# Patient Record
Sex: Male | Born: 1975 | Hispanic: No | Marital: Married | Smoking: Never smoker
Health system: Southern US, Community
[De-identification: ages and names within clinical notes are randomized; demographics above are authoritative.]

## PROBLEM LIST (undated history)

## (undated) DIAGNOSIS — T7840XA Allergy, unspecified, initial encounter: Secondary | ICD-10-CM

## (undated) HISTORY — DX: Allergy, unspecified, initial encounter: T78.40XA

---

## 1998-09-22 ENCOUNTER — Inpatient Hospital Stay (HOSPITAL_COMMUNITY): Admission: EM | Admit: 1998-09-22 | Discharge: 1998-09-30 | Payer: Self-pay | Admitting: Emergency Medicine

## 1998-09-30 ENCOUNTER — Encounter: Admission: RE | Admit: 1998-09-30 | Discharge: 1998-09-30 | Payer: Self-pay | Admitting: Family Medicine

## 2009-10-07 ENCOUNTER — Emergency Department (HOSPITAL_COMMUNITY): Admission: EM | Admit: 2009-10-07 | Discharge: 2009-10-08 | Payer: Self-pay | Admitting: Emergency Medicine

## 2011-08-23 ENCOUNTER — Emergency Department (HOSPITAL_COMMUNITY)
Admission: EM | Admit: 2011-08-23 | Discharge: 2011-08-23 | Disposition: A | Discharge status: Home / Self Care | Payer: Self-pay | Attending: Emergency Medicine | Admitting: Emergency Medicine

## 2011-08-23 ENCOUNTER — Emergency Department (HOSPITAL_COMMUNITY): Payer: Self-pay

## 2011-08-23 DIAGNOSIS — R0789 Other chest pain: Secondary | ICD-10-CM | POA: Insufficient documentation

## 2011-08-23 DIAGNOSIS — M25519 Pain in unspecified shoulder: Secondary | ICD-10-CM | POA: Insufficient documentation

## 2011-08-23 DIAGNOSIS — R1310 Dysphagia, unspecified: Secondary | ICD-10-CM | POA: Insufficient documentation

## 2011-08-23 DIAGNOSIS — J029 Acute pharyngitis, unspecified: Secondary | ICD-10-CM | POA: Insufficient documentation

## 2011-08-23 LAB — CBC
Hemoglobin: 14.9 g/dL (ref 13.0–17.0)
MCH: 32.3 pg (ref 26.0–34.0)
RBC: 4.61 MIL/uL (ref 4.22–5.81)
WBC: 12.3 10*3/uL — ABNORMAL HIGH (ref 4.0–10.5)

## 2011-08-23 LAB — COMPREHENSIVE METABOLIC PANEL
ALT: 44 U/L (ref 0–53)
Alkaline Phosphatase: 83 U/L (ref 39–117)
CO2: 27 mEq/L (ref 19–32)
Calcium: 9.4 mg/dL (ref 8.4–10.5)
Chloride: 98 mEq/L (ref 96–112)
GFR calc Af Amer: >60 mL/min (ref >60)
GFR calc non Af Amer: >60 mL/min (ref >60)
Glucose, Bld: 105 mg/dL — ABNORMAL HIGH (ref 70–99)
Sodium: 135 mEq/L (ref 135–145)
Total Bilirubin: 0.3 mg/dL (ref 0.3–1.2)

## 2011-08-23 LAB — DIFFERENTIAL
Basophils Relative: 0 % (ref 0–1)
Lymphs Abs: 1.1 10*3/uL (ref 0.7–4.0)
Monocytes Relative: 9 % (ref 3–12)
Neutro Abs: 10.1 10*3/uL — ABNORMAL HIGH (ref 1.7–7.7)
Neutrophils Relative %: 82 % — ABNORMAL HIGH (ref 43–77)

## 2011-10-03 ENCOUNTER — Emergency Department (HOSPITAL_COMMUNITY)
Admission: EM | Admit: 2011-10-03 | Discharge: 2011-10-03 | Disposition: A | Discharge status: Home / Self Care | Payer: Self-pay | Attending: Emergency Medicine | Admitting: Emergency Medicine

## 2011-10-03 DIAGNOSIS — K089 Disorder of teeth and supporting structures, unspecified: Secondary | ICD-10-CM | POA: Insufficient documentation

## 2011-10-03 DIAGNOSIS — Z9889 Other specified postprocedural states: Secondary | ICD-10-CM | POA: Insufficient documentation

## 2012-02-23 ENCOUNTER — Ambulatory Visit: Payer: Self-pay | Admitting: Family Medicine

## 2012-02-23 VITALS — BP 114/70 | HR 63 | Temp 97.7°F | Resp 18 | Ht 63.0 in | Wt 154.0 lb

## 2012-02-23 DIAGNOSIS — J069 Acute upper respiratory infection, unspecified: Secondary | ICD-10-CM

## 2012-02-23 DIAGNOSIS — L853 Xerosis cutis: Secondary | ICD-10-CM

## 2012-02-23 DIAGNOSIS — J029 Acute pharyngitis, unspecified: Secondary | ICD-10-CM

## 2012-02-23 DIAGNOSIS — L258 Unspecified contact dermatitis due to other agents: Secondary | ICD-10-CM

## 2012-02-23 MED ORDER — MAGIC MOUTHWASH W/LIDOCAINE: 5.0000 mL | Freq: Four times a day (QID) | ORAL | Status: DC | PRN

## 2012-02-23 NOTE — Progress Notes (Signed)
  Subjective:    Patient ID: Earl Meyer, male    DOB: [birth year], 36 y.o.   MRN: [id number]  HPI Earl Meyer is a 36 y.o. male Cough last Friday and Saturday (5 days ago) sore throat and dots noticed 4 days ago.  Lost voice few days ago.  No fever.  Slight ha with cough.  Slight soreness in upper back. NKI.  Positive sick contacts at work - other coworker with cough.  Tx:  New otc med for cold symptoms.    No flu shot this year.  Itching in lower legs if hot outside at times. On and off.  Tx: 1%hydrocortisone or alcohol, used for few days in November and January.       Also   Earl Meyer. spoke some english, some spanish.    Review of Systems  Constitutional: Negative for fever and chills.  HENT: Positive for ear pain, sore throat and voice change.        Ear pain few days ago.  Respiratory: Positive for cough. Negative for shortness of breath.   Skin:       Itching - lower legs.       Objective:   Physical Exam  Constitutional: He is oriented to person, place, and time. He appears well-developed and well-nourished.  HENT:  Head: Normocephalic and atraumatic.  Right Ear: Tympanic membrane, external ear and ear canal normal.  Left Ear: Tympanic membrane, external ear and ear canal normal.  Nose: No rhinorrhea.  Mouth/Throat: Oropharynx is clear and moist and mucous membranes are normal. No oropharyngeal exudate or posterior oropharyngeal erythema.  Eyes: Conjunctivae are normal. Pupils are equal, round, and reactive to light.  Neck: Neck supple.  Cardiovascular: Normal rate, regular rhythm, normal heart sounds and intact distal pulses.   No murmur heard. Pulmonary/Chest: Effort normal and breath sounds normal. He has no wheezes. He has no rhonchi. He has no rales.  Abdominal: Soft. There is no tenderness.  Lymphadenopathy:    He has no cervical adenopathy.  Neurological: He is alert and oriented to person, place, and time.  Skin: Skin is warm and  dry. No rash noted.       Dry skin lower legs with few excoriated areas.  No erythema.  Psychiatric: He has a normal mood and affect. His behavior is normal.       Results for orders placed in visit on 02/23/12  POCT RAPID STREP A (OFFICE)      Component Value Range   Rapid Strep A Screen Negative  Negative     Assessment & Plan:  Earl Meyer is a 36 y.o. male , 1. Pharyngitis  POCT rapid strep A  2. URI (upper respiratory infection)    3. Dry skin dermatitis     Sx care, fluids, rest, rtc precautions.  Magic mw prn, and samles #8 mucinex DM ES - 1 -2 q 12h prn.  rtc precautions.     Avoid alcohol to skin.  Try aveeno otc and if needed 1% hydrocortisone.  If not improving, return to clinic.  Also concerned about bumps on upper arms if pinching skin - no nodules noted, but likely palpating lipomas/normal subcutaneous fat.  If any enlargement of areas, return to clinic to recheck.   Spanish and english spoken, plan reviewed and understanding expressed.

## 2012-02-23 NOTE — Patient Instructions (Signed)
No mas alcohol a piel.    [place] Aveeno lotion dos veces cada dia. Si necesario,  hydrocortisone 1% dos veces cada dia.  Regrese si no esta mejor con estos tratamientos.  Si tiene fiebre o Copywriter, advertising, regrese a Event organiser o cuarto de Associate Professor.   Faringitis (viral y [place]) (Pharyngitis, Viral and Bacterial) Es una inflamacin (irritacin) o infeccin de la faringe. Tambin se denomina dolor de garganta.  CAUSAS La mayor parte de las anginas son causadas por virus y son parte de un resfro. Sin embargo, algunas anginas son ocasionadas por la bacteria estreptococo (germen) o por otras bacterias. La causa de la angina tambin puede ser el goteo nasal proveniente de los senos [place], [place] y, en algunos [place], se produce incluso por dormir con la boca abierta. Las anginas infecciosas pueden contagiarse de Neomia Dear persona a otra por la tos, el estornudo y por compartir tasas o cubiertos. TRATAMIENTO Las anginas de causa viral generalmente duran entre 3 y [address] Estas infecciones virales generalmente mejoran sin antibiticos (medicamentos que destruyen grmenes). Las anginas por estreptococo y otras bacterias (grmenes) generalmente mejoran despus de 24 a 48 horas de Microbiologist con antibiticos. INSTRUCCIONES PARA EL CUIDADO DOMICILIARIO  Si en profesional considera que se trata de una infeccin bacteriana o si hay una prueba positiva para Event organiser, Administrator, sports un antibitico. Debe tomar todos los antibiticos Librarian, academic. Si no completa el tratamiento con antibiticos, usted o su hijo podrn enfermar nuevamente. Si usted o su hijo presentan un estreptococo en la garganta y no completan el tratamiento, podran sufrir un trastorno grave en el corazn o en los riones.   Beba gran cantidad de lquidos. Alrededor de 8-10 vasos grandes de agua por da. (Puede ser agua, jugos, licuados de frutas, Kool-aid, [place], [place], etc.).   Utilice los  medicamentos de venta libre o de prescripcin para Chief Technology Officer, Environmental health practitioner o la [place], segn se lo indique el profesional que lo asiste.   Descanse lo suficiente.   Hgase grgaras con agua salada (1/2 cucharadita de sal en un vaso de agua) cada 1-2 horas si lo necesita para [employer].   Si el paciente es mayor de [place], ofrzcale caramelos duros o Engineer, manufacturing o pastillas para la tos.  SOLICITE ATENCIN MDICA SI:  Si le aparecen bultos grandes y dolorosos en el cuello.   Presenta una erupcin.   Elimina un esputo verde, marrn-amarillento o sanguinolento.   El beb tiene ms de 3 meses y su temperatura rectal es de 100.5 F (38.1 C) o ms durante ms de 1 da.  SOLICITE ATENCIN MDICA DE INMEDIATO SI:  Siente rigidez en el cuello.   Usted o su hijo babean o no pueden tragar lquidos.   Usted o su hijo vomitan, no pueden retener los [employer].   Usted o su hijo presentan dolor intenso, que no se alivia con los [employer] han recetado.   Usted o su hijo presentan dificultad para respirar (y no se debe a la [place]).   Usted o su hijo no pueden abrir Scientist, product/process development.   Usted o su nio presentan aumento del dolor, hinchazn, enrojecimiento en el cuello.   Tiene fiebre.   Su beb tiene ms de 3 meses y su temperatura rectal es de 102 F (38.9 C) o ms.   Su beb tiene 3 meses o menos y su temperatura rectal es de 100.4 F (38 C) o ms.  EST  SEGURO QUE:   Comprende las instrucciones para el alta mdica.   Controlar su enfermedad.   Solicitar atencin mdica de inmediato segn las indicaciones.  Document Released: 09/01/2005 Document Revised: 11/11/2011 [facility] Patient Information 2012 [place], [place].

## 2012-08-06 ENCOUNTER — Ambulatory Visit: Payer: Self-pay | Admitting: Emergency Medicine

## 2012-08-06 VITALS — BP 108/66 | HR 60 | Temp 97.8°F | Resp 16 | Ht 63.5 in | Wt 145.0 lb

## 2012-08-06 DIAGNOSIS — R002 Palpitations: Secondary | ICD-10-CM

## 2012-08-06 DIAGNOSIS — F430 Acute stress reaction: Secondary | ICD-10-CM

## 2012-08-06 DIAGNOSIS — R4589 Other symptoms and signs involving emotional state: Secondary | ICD-10-CM

## 2012-08-06 MED ORDER — LORAZEPAM 0.5 MG PO TABS: 0.5000 mg | ORAL_TABLET | Freq: Three times a day (TID) | ORAL | Status: AC | PRN

## 2012-08-06 NOTE — Progress Notes (Signed)
  Date:  08/06/2012   Name:  Earl Meyer   DOB:  02/21/1976   MRN:  [id number] Gender: male Age: 36 y.o.  PCP:  No primary provider on file.    Chief Complaint: Chest Pain   History of Present Illness:  Earl Meyer is a 36 y.o. pleasant patient who presents with the following:  Says that several times in the past week he has felt a quivering in his chest.  Says that it feels like his chest is shaking and lasts seconds to minutes but episode lasts all day.  No chest pain, heartburn or shortness of breath. No history suggestive of GERD.  No cough, nasal congestion or postnasal drainage.  Does admit to frequent feelings of anxiety related to work and the required speed at work.  Non smoker and non drinker.  No prior episodes recently but has experienced the same in remote past.  No perioral or digital numbness.  There is no problem list on file for this patient.   No past medical history on file.  No past surgical history on file.  History  Substance Use Topics  . Smoking status: Never Smoker   . Smokeless tobacco: Not on file  . Alcohol Use: Not on file    No family history on file.  Allergies  Allergen Reactions  . Penicillins     Medication list has been reviewed and updated.  Current Outpatient Prescriptions on File Prior to Visit  Medication Sig Dispense Refill  . Alum & Mag Hydroxide-Simeth (MAGIC MOUTHWASH W/LIDOCAINE) SOLN Take 5 mLs by mouth 4 (four) times daily as needed.  120 mL  0    Review of Systems:  As per HPI, otherwise negative.    Physical Examination: Filed Vitals:   08/06/12 1023  BP: 108/66  Pulse: 60  Temp: 97.8 F (36.6 C)  Resp: 16   Filed Vitals:   08/06/12 1023  Height: 5' 3.5" (1.613 m)  Weight: 145 lb (65.772 kg)   Body mass index is 25.28 kg/(m^2). Ideal Body Weight: Weight in (lb) to have BMI = 25: 143.1   GEN: WDWN, NAD, Non-toxic, A & O x 3 HEENT: Atraumatic, Normocephalic. Neck supple. No  masses, No LAD. Ears and Nose: No external deformity. CV: RRR, No M/G/R. No JVD. No thrill. No extra heart sounds. PULM: CTA B, no wheezes, crackles, rhonchi. No retractions. No resp. distress. No accessory muscle use. ABD: S, NT, ND, +BS. No rebound. No HSM. EXTR: No c/c/e NEURO Normal gait.  PSYCH: Normally interactive. Conversant. Not depressed appearing.  Calm demeanor.    Assessment and Plan: EKG Anxiety reaction. Recommended stress test but has no insurance.  Will not have it done  Ativan  Carmelina Dane, MD

## 2012-08-06 NOTE — Progress Notes (Signed)
  Subjective:    Patient ID: Earl Meyer, male    DOB: July 31, 1976, 36 y.o.   MRN: [id number]  HPI For a couple days has noticed trouble feels like something "shaking" in his chest, behind sternal area. Has noticed while at rest at home on the sofa. Nothing seems to bring this on. Nothing seems to make it better.    Review of Systems     Objective:   Physical Exam        Assessment & Plan:

## 2012-10-23 ENCOUNTER — Ambulatory Visit: Payer: Self-pay | Admitting: Family Medicine

## 2012-10-23 VITALS — BP 138/82 | HR 74 | Temp 97.4°F | Resp 17 | Ht 64.0 in | Wt 149.0 lb

## 2012-10-23 DIAGNOSIS — S0500XA Injury of conjunctiva and corneal abrasion without foreign body, unspecified eye, initial encounter: Secondary | ICD-10-CM

## 2012-10-23 DIAGNOSIS — S058X9A Other injuries of unspecified eye and orbit, initial encounter: Secondary | ICD-10-CM

## 2012-10-23 DIAGNOSIS — T1500XA Foreign body in cornea, unspecified eye, initial encounter: Secondary | ICD-10-CM

## 2012-10-23 MED ORDER — ERYTHROMYCIN 5 MG/GM OP OINT: TOPICAL_OINTMENT | Freq: Four times a day (QID) | OPHTHALMIC | Status: DC

## 2012-10-23 MED ORDER — DICLOFENAC SODIUM 0.1 % OP SOLN: 1.0000 [drp] | Freq: Four times a day (QID) | OPHTHALMIC | Status: DC

## 2012-10-23 NOTE — Progress Notes (Signed)
Subjective:    Patient ID: Earl Meyer, male    DOB: 1976/07/10, 36 y.o.   MRN: [id number]  HPI  2d ago he was scraping off paint and a little flick of paint went into his right eye and feels like it scratched it.  Feels like it is stuck in his upper eyelid and scratches his eye whenever he blinks. yesterday he went to pharmacy who gave him a little lubricant drops to use which isn't helping Taking advil for pain, light makes eye more painful.  No vision changes but hard to keep eye open with light. Tearing a lot. Past Medical History  Diagnosis Date  . Allergy     Current Outpatient Prescriptions on File Prior to Visit  Medication Sig Dispense Refill  . Alum & Mag Hydroxide-Simeth (MAGIC MOUTHWASH W/LIDOCAINE) SOLN Take 5 mLs by mouth 4 (four) times daily as needed.  120 mL  0     Review of Systems  Constitutional: Negative for fever, chills, diaphoresis and fatigue.  HENT: Positive for congestion and rhinorrhea. Negative for ear pain, sore throat, sneezing, postnasal drip, sinus pressure and ear discharge.   Eyes: Positive for photophobia, pain and redness. Negative for discharge, itching and visual disturbance.  Skin: Negative for pallor and rash.  Neurological: Positive for headaches. Negative for dizziness.  Hematological: Negative for adenopathy.  Psychiatric/Behavioral: Positive for sleep disturbance.        BP 138/82  Pulse 74  Temp 97.4 F (36.3 C) (Oral)  Resp 17  Ht 5\' 4"  (1.626 m)  Wt 149 lb (67.586 kg)  BMI 25.58 kg/m2  SpO2 97%  Objective:   Physical Exam  Constitutional: He is oriented to person, place, and time. He appears well-developed and well-nourished. No distress.  HENT:  Head: Normocephalic and atraumatic.  Eyes: EOM are normal. No foreign bodies found. Right eye exhibits chemosis. Right eye exhibits no discharge, no exudate and no hordeolum. Left eye exhibits no chemosis and no discharge. Right conjunctiva is injected. Left conjunctiva  is injected. No scleral icterus. Right pupil is round and reactive. Left pupil is round and reactive. Pupils are unequal.       On upper medial conjunctiva of right upper lid there is a spot of erhythema and mild edema but no distinct lesion, abrasion, or foreign body seen there.  Concern that right pupil is slightly smaller than left but unable to confirm  On fluoricin stain of right eye, no distinct abrasion seen though several small pinpoint of stain collection were noted on conjunctive. No abrasion of cornea were noted with the naked eye.  Pulmonary/Chest: Effort normal.  Neurological: He is alert and oriented to person, place, and time.  Skin: Skin is warm and dry. He is not diaphoretic.  Psychiatric: He has a normal mood and affect. His behavior is normal.  Right eye copiously  Irrigated with sterile water.   Corneal lesion of right eye of pinpoint abrasion vs FB could only be seen with the opthalmicscope with the pt looking straight ahead and approaching from the lateral side - the was a pin point lesion approximately around the 7 o'clock portion of the cornea over the lens but I had difficulty visualizing it again when I had the pt lay flat though did attempt to remove it once by dabbing a wet cotton swab onto the area - however I am unsure if there was any FB left vs just abrasion since it is microscopic.    Vision on recheck after proparicaine was  administered was 20/25 OD, OS, and OU Assessment & Plan:   1. Conjunctival abrasion  erythromycin [facility]) ophthalmic ointment, diclofenac (VOLTAREN) 0.1 % ophthalmic solution  2. Corneal abrasion    3. Corneal foreign body    I had Debbra Riding, [place], also look at the pt's eye and she saw the corneal abrasion vs microscopic foreign body and will recheck the pt tomorrow to ensure he is improving. On recheck tomorrow, I recommend numbing again with proparacaine and fluorecine stain the right eye and then attempt visualization in a dark room with the  opthalmiscope while have the assistant hold the wood's lamp in front of the eye.

## 2012-10-24 ENCOUNTER — Ambulatory Visit: Payer: Self-pay | Admitting: Internal Medicine

## 2012-10-24 VITALS — BP 114/74 | HR 58 | Temp 97.5°F | Resp 16 | Ht 63.5 in | Wt 149.0 lb

## 2012-10-24 DIAGNOSIS — S058X9A Other injuries of unspecified eye and orbit, initial encounter: Secondary | ICD-10-CM

## 2012-10-24 DIAGNOSIS — S0500XA Injury of conjunctiva and corneal abrasion without foreign body, unspecified eye, initial encounter: Secondary | ICD-10-CM

## 2012-10-24 MED ORDER — OFLOXACIN 0.3 % OP SOLN: 1.0000 [drp] | OPHTHALMIC | Status: DC

## 2012-10-24 NOTE — Progress Notes (Signed)
  Subjective:    Patient ID: Earl Earl Meyer, male    DOB: February 09, 1976, 36 y.o.   MRN: [id number]  HPI  Earl Earl Meyer is a 36 yr old male here for recheck of right eye injury.  He was seen here yesterday morning, 2 days after a paint chip flew into his eye.  He is feeling a little better from yesterday.  He is experiencing less pain and no photophobia.  He is experiencing lots of itching after he instills the drops into his eye.      Review of Systems  Constitutional: Negative.   HENT: Negative.   Eyes: Positive for redness and itching. Negative for photophobia and visual disturbance.  Respiratory: Negative.   Cardiovascular: Negative.   Musculoskeletal: Negative.   Neurological: Negative for headaches.       Objective:   Physical Exam  Vitals reviewed. Constitutional: He is oriented to person, place, and time. He appears well-developed and well-nourished. No distress.  HENT:  Head: Normocephalic and atraumatic.  Eyes: EOM and lids are normal. Pupils are equal, round, and reactive to light. No foreign bodies found. Right eye exhibits no discharge, no exudate and no hordeolum. No foreign body present in the right eye. Left eye exhibits no discharge, no exudate and no hordeolum. No foreign body present in the left eye. Right conjunctiva is injected. Right conjunctiva has no hemorrhage. Left conjunctiva is injected. Left conjunctiva has no hemorrhage. No scleral icterus.  Slit lamp exam:      The right eye shows corneal abrasion. The right eye shows no corneal ulcer and no foreign body.    Neurological: He is alert and oriented to person, place, and time.  Skin: Skin is warm and dry.  Psychiatric: He has a normal mood and affect. His behavior is normal.     Filed Vitals:   10/24/12 1154  BP: 114/74  Pulse: 58  Temp: 97.5 F (36.4 C)  Resp: 16         Assessment & Plan:   1. Superficial injury of cornea   2. Superficial injury of conjunctiva    Mr.  Earl Meyer is a 36 yr old male here with corneal and conjunctival abrasion after getting a paint chip in his eye 3 days ago.  He was seen here yesterday and placed on erythromycin ointment and diclofenac drops.  His symptoms are improving.  He has less pain and no photophobia today.  On exam there is no corneal ulceration and no foreign body.  Suspect that the itching and redness he continues to experience may be irritation from the drops.  I have discontinued his drop from yesterday and have sent Ocuflox.  He will follow-up here in 48 hours, sooner if anything changes.    Pt examined and discussed with Dr. Perrin Maltese

## 2012-10-24 NOTE — Patient Instructions (Addendum)
Stop the medicines that you got yesterday.  Start the new drops today.  Use them every 4 hours.  Come back in 2 days.

## 2012-10-26 ENCOUNTER — Ambulatory Visit: Payer: Self-pay | Admitting: Family Medicine

## 2012-10-26 VITALS — BP 126/75 | HR 79 | Temp 98.8°F | Resp 16 | Ht 64.0 in | Wt 151.6 lb

## 2012-10-26 DIAGNOSIS — S0500XA Injury of conjunctiva and corneal abrasion without foreign body, unspecified eye, initial encounter: Secondary | ICD-10-CM

## 2012-10-26 DIAGNOSIS — S058X9A Other injuries of unspecified eye and orbit, initial encounter: Secondary | ICD-10-CM

## 2012-10-26 NOTE — Progress Notes (Deleted)
  [facility]:  Office Visit  Chief Complaint:  Chief Complaint  Patient presents with  . Follow-up    eye injury    HPI: Earl Meyer is a 36 y.o. male who complains of  ***  Past Medical History  Diagnosis Date  . Allergy    No past surgical history on file. History   Social History  . Marital Status: Married    Spouse Name: N/A    Number of Children: N/A  . Years of Education: N/A   Social History Main Topics  . Smoking status: Never Smoker   . Smokeless tobacco: None  . Alcohol Use: No  . Drug Use: No  . Sexually Active: Yes    Birth Control/ Protection: None   Other Topics Concern  . None   Social History Narrative  . None   No family history on file. Allergies  Allergen Reactions  . Penicillins    Prior to Admission medications   Medication Sig Start Date End Date Taking? Authorizing Provider  ofloxacin (OCUFLOX) 0.3 % ophthalmic solution Place 1 drop into the left eye every 4 (four) hours. 10/24/12  Yes Godfrey Pick, PA-C  Alum & Mag Hydroxide-Simeth (MAGIC MOUTHWASH W/LIDOCAINE) SOLN Take 5 mLs by mouth 4 (four) times daily as needed. 02/23/12   Shade Flood, MD  ibuprofen (ADVIL,MOTRIN) 100 MG tablet Take 100 mg by mouth every 6 (six) hours as needed.    Historical Provider, MD     ROS: The patient denies fevers, chills, night sweats, unintentional weight loss, chest pain, palpitations, wheezing, dyspnea on exertion, nausea, vomiting, abdominal pain, dysuria, hematuria, melena, numbness, weakness, or tingling. ***  All other systems have been reviewed and were otherwise negative with the exception of those mentioned in the HPI and as above.    PHYSICAL EXAM: Filed Vitals:   10/26/12 1802  BP: 126/75  Pulse: 79  Temp: 98.8 F (37.1 C)  Resp: 16   Filed Vitals:   10/26/12 1802  Height: 5\' 4"  (1.626 m)  Weight: 151 lb 9.6 oz (68.765 kg)   Body mass index is 26.02 kg/(m^2).  General: Alert, no acute  distress HEENT:  Normocephalic, atraumatic, oropharynx patent.  Cardiovascular:  Regular rate and rhythm, no rubs murmurs or gallops.  No Carotid bruits, radial pulse intact. No pedal edema.  Respiratory: Clear to auscultation bilaterally.  No wheezes, rales, or rhonchi.  No cyanosis, no use of accessory musculature GI: No organomegaly, abdomen is soft and non-tender, positive bowel sounds.  No masses. Skin: No rashes. Neurologic: Facial musculature symmetric. Psychiatric: Patient is appropriate throughout our interaction. Lymphatic: No cervical lymphadenopathy Musculoskeletal: Gait intact.   LABS: Results for orders placed in visit on 02/23/12  POCT RAPID STREP A (OFFICE)      Component Value Range   Rapid Strep A Screen Negative  Negative     EKG/XRAY:   Primary read interpreted by Dr. Conley Rolls at [facility].   ASSESSMENT/PLAN: No diagnosis found.     Hamilton Capri PHUONG, DO 10/26/2012 6:27 PM

## 2012-10-26 NOTE — Patient Instructions (Addendum)
Continue using the eye drops every four hours.  I have sent a referral to an eye doctor.  They will call you with an appointment.

## 2012-10-26 NOTE — Progress Notes (Signed)
Subjective:    Patient ID: Earl Meyer, male    DOB: 01/05/1976, 36 y.o.   MRN: [id number]  HPI  Earl Meyer is a 36 yr old male here for follow-up on an eye injury.  He was initially seen on 10/23/2012, two days after a paint chip flew into his eye.  At that time it was determined that he had conjunctival abrasion, and corneal abrasion vs. Possible foreign body.  He was placed on erythromycin ointment with close follow up.    He was seen the following day at which point he was feeling much better.  He had some redness and itching of the eye that was attributed to the erythromycin.  He was changed to Ocuflox.  On exam it was determined that he was improving and that he could return in 48 hours for recheck.  Today he states he is feeling much better.  The eye is no longer itching now that we have discontinued the erythromycin.  He is using Ocuflox as directed.  He is not experiencing pain or watering of the eye.  He no longer has photophobia.  He does state that occasionally it feels like "a cloth comes over the eye".  There is a language barrier which makes it difficult for him to elaborate on this.      Review of Systems  Constitutional: Negative.   HENT: Negative.   Eyes: Positive for redness and visual disturbance. Negative for photophobia, pain, discharge and itching.  Cardiovascular: Negative.   Gastrointestinal: Negative.   Musculoskeletal: Negative.   Neurological: Negative.        Objective:   Physical Exam  Vitals reviewed. Constitutional: He is oriented to person, place, and time. He appears well-developed and well-nourished. No distress.  HENT:  Head: Normocephalic and atraumatic.  Eyes: EOM and lids are normal. Pupils are equal, round, and reactive to light. Right eye exhibits no discharge and no hordeolum. Left eye exhibits no discharge and no hordeolum. Right conjunctiva is injected. Right conjunctiva has no hemorrhage. Left conjunctiva is not  injected. Left conjunctiva has no hemorrhage. No scleral icterus.  Fundoscopic exam:      The right eye shows red reflex.      The left eye shows red reflex. Slit lamp exam:      The right eye shows no corneal abrasion, no corneal ulcer and no fluorescein uptake.         Conjunctival and corneal abrasion much improved; very small irregularity of the right cornea, possibly foreign body as it does not take up stain  VIsual acuity 20/20 in each eye  Neurological: He is alert and oriented to person, place, and time.  Skin: Skin is warm and dry.  Psychiatric: He has a normal mood and affect. His behavior is normal.      Filed Vitals:   10/26/12 1802  BP: 126/75  Pulse: 79  Temp: 98.8 F (37.1 C)  Resp: 16       Assessment & Plan:   1. Corneal abrasion  Ambulatory referral to Ophthalmology  2. Conjunctival abrasion  Ambulatory referral to Ophthalmology    Earl Meyer is a 36 yr old male s/p eye injury 5 days ago.  He is greatly improved from his first exam here.  Corneal and conjunctival abrasions are almost completely resolved.  There is a very tiny irregularity of the right cornea visible with ophthalmoscope.  It does not take up fluorescein stain.  It is possible that this was pre-existing before the injury,  but small foreign body cannot be ruled out.  I have referred him to ophthalmology for further evaluation and slit lamp examination.  He will continue using the Ocuflox drops q4h.  Pt examined and discussed with Dr. Katrinka Blazing.

## 2012-10-29 ENCOUNTER — Ambulatory Visit: Payer: Self-pay | Admitting: Family Medicine

## 2012-10-29 VITALS — BP 105/67 | HR 70 | Temp 98.1°F | Resp 16 | Ht 65.0 in | Wt 150.0 lb

## 2012-10-29 DIAGNOSIS — J00 Acute nasopharyngitis [common cold]: Secondary | ICD-10-CM

## 2012-10-29 DIAGNOSIS — R509 Fever, unspecified: Secondary | ICD-10-CM

## 2012-10-29 DIAGNOSIS — J029 Acute pharyngitis, unspecified: Secondary | ICD-10-CM

## 2012-10-29 LAB — POCT CBC
Granulocyte percent: 78.9 %G (ref 37–80)
MID (cbc): 0.9 (ref 0–0.9)
MPV: 9.1 fL (ref 0–99.8)
POC Granulocyte: 7.4 — AB (ref 2–6.9)
POC LYMPH PERCENT: 11.1 %L (ref 10–50)
POC MID %: 10.1 %M (ref 0–12)
Platelet Count, POC: 265 10*3/uL (ref 142–424)
RBC: 4.84 M/uL (ref 4.69–6.13)
RDW, POC: 13.1 %

## 2012-10-29 LAB — POCT RAPID STREP A (OFFICE): Rapid Strep A Screen: NEGATIVE

## 2012-10-29 MED ORDER — AZITHROMYCIN 250 MG PO TABS: ORAL_TABLET | ORAL | Status: DC

## 2012-10-29 NOTE — Patient Instructions (Addendum)
If you are not better in the next couple of days fill and use the azithromycin prescription.  In this case also give me a call.  Rest and drink plenty of fluids

## 2012-10-29 NOTE — Progress Notes (Signed)
[facility] [address], [place] [address] [phone]  Date:  10/29/2012   Name:  Earl Meyer   DOB:  02/20/1976   MRN:  [id number]  PCP:  No primary provider on file.    Chief Complaint: Sore Throat, Headache, Fever, Cough and Otalgia   History of Present Illness:  Earl Meyer is a 36 y.o. very pleasant male patient who presents with the following:  He has been ill for the last 3 days.  He noted a ST, body aches, fever.  Not much cough- he is coughing just a little bit today.   He has used advil but last night his fever did not seem to respond.   His ST is the main symptom.   No vomiting or diarrhea.    A couple of weeks ago his son was ill with a virus and had a ST.   He is able to tolerate food and liquids ok.   There is no problem list on file for this patient.   Past Medical History  Diagnosis Date  . Allergy     History reviewed. No pertinent past surgical history.  History  Substance Use Topics  . Smoking status: Never Smoker   . Smokeless tobacco: Not on file  . Alcohol Use: No    Family History  Problem Relation Age of Onset  . Cancer Father     Allergies  Allergen Reactions  . Penicillins     Medication list has been reviewed and updated.  Current Outpatient Prescriptions on File Prior to Visit  Medication Sig Dispense Refill  . ibuprofen (ADVIL,MOTRIN) 100 MG tablet Take 100 mg by mouth every 6 (six) hours as needed.      Marland Kitchen ofloxacin (OCUFLOX) 0.3 % ophthalmic solution Place 1 drop into the left eye every 4 (four) hours.  5 mL  0  . Alum & Mag Hydroxide-Simeth (MAGIC MOUTHWASH W/LIDOCAINE) SOLN Take 5 mLs by mouth 4 (four) times daily as needed.  120 mL  0    Review of Systems:  As per HPI- otherwise negative.   Physical Examination: Filed Vitals:   10/29/12 1126  BP: 105/67  Pulse: 70  Temp: 98.1 F (36.7 C)  Resp: 16   Filed Vitals:   10/29/12 1126  Height: 5\' 5"   (1.651 m)  Weight: 150 lb (68.04 kg)   Body mass index is 24.96 kg/(m^2). Ideal Body Weight: Weight in (lb) to have BMI = 25: 149.9   GEN: WDWN, NAD, Non-toxic, A & O x 3 HEENT: Atraumatic, Normocephalic. Neck supple. No masses, No LAD. Bilateral TM wnl, oropharynx normal.  PEERL,EOMI.   Ears and Nose: No external deformity. CV: RRR, No M/G/R. No JVD. No thrill. No extra heart sounds. PULM: CTA B, no wheezes, crackles, rhonchi. No retractions. No resp. distress. No accessory muscle use. ABD: S, NT, ND, +BS. No rebound. No HSM. EXTR: No c/c/e NEURO Normal gait.  PSYCH: Normally interactive. Conversant. Not depressed or anxious appearing.  Calm demeanor.     Results for orders placed in visit on 10/29/12  POCT RAPID STREP A (OFFICE)      Component Value Range   Rapid Strep A Screen Negative  Negative  POCT CBC      Component Value Range   WBC 9.4  4.6 - 10.2 K/uL   Lymph, poc 1.0  0.6 - 3.4   POC LYMPH PERCENT 11.1  10 - 50 %L   MID (cbc) 0.9  0 -  0.9   POC MID % 10.10  0 - 12 %M   POC Granulocyte 7.4 (*) 2 - 6.9   Granulocyte percent 78.9  37 - 80 %G   RBC 4.84  4.69 - 6.13 M/uL   Hemoglobin 14.9  14.1 - 18.1 g/dL   HCT, POC 45.4  09.8 - 53.7 %   MCV 96.9  80 - 97 fL   MCH, POC 30.8  27 - 31.2 pg   MCHC 31.8  31.8 - 35.4 g/dL   RDW, POC 11.9     Platelet Count, POC 265  142 - 424 K/uL   MPV 9.1  0 - 99.8 fL     Assessment and Plan: 1. Sore throat  POCT rapid strep A, POCT CBC, azithromycin (ZITHROMAX) 250 MG tablet  2. Fever  POCT CBC  advised Andrena Mews that he likely has a viral illness.  However, if he is still ill in 2 days he can fill and start the azithromycin. Also asked him to please give me a call if he is not better in the next couple of days- Sooner if worse.      Abbe Amsterdam, MD

## 2013-01-12 NOTE — Progress Notes (Signed)
Below history and physical exam reviewed in detail; personally examined pt and agree with current assessment and plan.  Proceed with ophthalmology referral.

## 2013-07-22 ENCOUNTER — Ambulatory Visit (INDEPENDENT_AMBULATORY_CARE_PROVIDER_SITE_OTHER): Payer: BC Managed Care – PPO | Admitting: Emergency Medicine

## 2013-07-22 VITALS — BP 112/72 | HR 84 | Temp 98.9°F | Resp 16 | Ht 63.5 in | Wt 147.0 lb

## 2013-07-22 DIAGNOSIS — J018 Other acute sinusitis: Secondary | ICD-10-CM

## 2013-07-22 MED ORDER — LEVOFLOXACIN 500 MG PO TABS: 500.0000 mg | ORAL_TABLET | Freq: Every day | ORAL | Status: AC

## 2013-07-22 NOTE — Patient Instructions (Signed)

## 2013-07-22 NOTE — Progress Notes (Signed)
[facility] [address], [place] [address] [phone]  Date:  07/22/2013   Name:  Earl Meyer   DOB:  September 12, 1976   MRN:  [id number]  PCP:  No primary provider on file.    Chief Complaint: Sore Throat, Otalgia and Cough   History of Present Illness:  Earl Meyer is a 37 y.o. very pleasant male patient who presents with the following:  [name] with a sore throat and cough productive scant mucoid sputum.   Has nasal congestion and myalgias and arthralgias.  No wheezing or shortness of breath.  No nausea or vomiting.  No improvement with over the counter medications or other home remedies. Denies other complaint or health concern today.   There are no active problems to display for this patient.   Past Medical History  Diagnosis Date  . Allergy     No past surgical history on file.  History  Substance Use Topics  . Smoking status: Never Smoker   . Smokeless tobacco: Not on file  . Alcohol Use: No    Family History  Problem Relation Age of Onset  . Cancer Father     Allergies  Allergen Reactions  . Penicillins     Medication list has been reviewed and updated.  Current Outpatient Prescriptions on File Prior to Visit  Medication Sig Dispense Refill  . Alum & Mag Hydroxide-Simeth (MAGIC MOUTHWASH W/LIDOCAINE) SOLN Take 5 mLs by mouth 4 (four) times daily as needed.  120 mL  0  . azithromycin (ZITHROMAX) 250 MG tablet Use as a zpack  6 tablet  0  . ibuprofen (ADVIL,MOTRIN) 100 MG tablet Take 100 mg by mouth every 6 (six) hours as needed.      Marland Kitchen ofloxacin (OCUFLOX) 0.3 % ophthalmic solution Place 1 drop into the left eye every 4 (four) hours.  5 mL  0   No current facility-administered medications on file prior to visit.    Review of Systems:  As per HPI, otherwise negative.    Physical Examination: Filed Vitals:   07/22/13 1403  BP: 112/72  Pulse: 84  Temp: 98.9 F (37.2 C)  Resp: 16   Filed  Vitals:   07/22/13 1403  Height: 5' 3.5" (1.613 m)  Weight: 147 lb (66.679 kg)   Body mass index is 25.63 kg/(m^2). Ideal Body Weight: Weight in (lb) to have BMI = 25: 143.1  GEN: WDWN, NAD, Non-toxic, A & O x 3 HEENT: Atraumatic, Normocephalic. Neck supple. No masses, No LAD. Ears and Nose: No external deformity. CV: RRR, No M/G/R. No JVD. No thrill. No extra heart sounds. PULM: CTA B, no wheezes, crackles, rhonchi. No retractions. No resp. distress. No accessory muscle use. ABD: S, NT, ND, +BS. No rebound. No HSM. EXTR: No c/c/e NEURO Normal gait.  PSYCH: Normally interactive. Conversant. Not depressed or anxious appearing.  Calm demeanor.    Assessment and Plan: Sinusitis augmentin  Signed,  Phillips Odor, MD

## 2013-11-07 ENCOUNTER — Ambulatory Visit (INDEPENDENT_AMBULATORY_CARE_PROVIDER_SITE_OTHER): Payer: BC Managed Care – PPO | Admitting: Family Medicine

## 2013-11-07 VITALS — BP 103/74 | HR 70 | Temp 97.7°F | Resp 16 | Wt 150.0 lb

## 2013-11-07 DIAGNOSIS — J069 Acute upper respiratory infection, unspecified: Secondary | ICD-10-CM

## 2013-11-07 DIAGNOSIS — R059 Cough, unspecified: Secondary | ICD-10-CM

## 2013-11-07 DIAGNOSIS — L299 Pruritus, unspecified: Secondary | ICD-10-CM

## 2013-11-07 DIAGNOSIS — R05 Cough: Secondary | ICD-10-CM

## 2013-11-07 MED ORDER — BENZONATATE 100 MG PO CAPS: 100.0000 mg | ORAL_CAPSULE | Freq: Three times a day (TID) | ORAL | Status: DC | PRN

## 2013-11-07 MED ORDER — HYDROCODONE-HOMATROPINE 5-1.5 MG/5ML PO SYRP: 5.0000 mL | ORAL_SOLUTION | ORAL | Status: DC | PRN

## 2013-11-07 NOTE — Progress Notes (Signed)
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 He has been sick for 3 or 4 days with a cough and some soreness in his throat. He has not been running up any fever. He has been a little achy. He has a postnasal drainage sensation. He is coughing up very little. He took some Robitussin without relief. Tube his family members are sick.  Patient works nights.  Objective: TMs normal. Throat clear. Sinuses not acutely tender. Neck supple without significant nodes. Chest is clear to auscultation. Heart regular without murmurs.  Assessment: URI Cough  Plan: Fluids Rest Hycodan Tessalon

## 2013-11-07 NOTE — Patient Instructions (Addendum)
Take Tessalon (benzonatate)  one or two pills three times when you get up after sleeping, when working, and again when you go home.  Take Hycodan (hydrocodone) cough syrup 1 teaspoon ( 5 mL ) every 4-6 hours before bedtime if needed for cough. It will make you sleepy  Take over the counter Claritin-D (loratadine D) one daily for the congestion.  This should also help the itching back.  Use some non-prescription Hydrocortisone Cream 1% on the back if itching  Drink lots of fluids  Get plenty of rest  Return if not improving  Infeccin de las vas areas superiores en los adultos (Upper Respiratory Infection, Adult)  La infeccin respiratoria de las vas areas superiores se conoce tambin como resfro comn. Las vas areas superiores [employer] senos nasales, la garganta, la trquea, y los bronquios. Los bronquios son las vas areas que conducen el aire a los pulmones. La mayor parte de las personas mejora luego de una [place], Biomedical engineer los sntomas pueden durar [employer]. La tos residual puede durar ms. CAUSAS Varios tipos de virus pueden causar la infeccin de los tejidos que cubren las vas areas superiores. Los tejidos se irritan y se inflaman y se originan secreciones. Tambin es frecuente la produccin de moco. El resfro es contagioso. El virus se disemina fcilmente a otras personas por contacto oral. Aqu se incluyen los besos, el compartir un vaso y el toser o Engineering geologist. Tambin puede diseminarse tocndose la boca o la [place] y luego tocando una superficie que luego tocan Economist.  SNTOMAS Los sntomas se desarrollan entre uno y [place] luego de Cytogeneticist en contacto con el virus. Pueden variar de Neomia Dear persona a otra. Incluyen:  Secrecin nasal.  Estornudos  Congestin nasal.  Irritacin de los senos nasales.  Dolor de Advertising copywriter.  Prdida de la voz (laringitis).  Tos.  Fatiga.  Dolores musculares.  Prdida del apetito.  Dolor de [place].  Fiebre no  muy elevada. DIAGNSTICO Puede diagnosticarse a s mismo la infeccin respiratoria, segn los sntomas habituales, ya que la mayor parte de las personas se resfra dos o tres veces al ao. El profesional puede confirmarlo basndose en el examen fsico. Lo ms importante es que el profesional verifique que los sntomas no se deben a otra enfermedad como anginas, sinusitis, neumona, asma o epiglotitis. Para diagnosticar el resfrio comn, no es necesario que haga anlisis de [place], pruebas en la garganta o radiografas, pero en algunos casos puede ser de utilidad para excluir otros problemas ms graves. El mdico decidir si necesita otras pruebas. RIESGOS Y COMPLICACIONES Tendr mayor riesgo de sufrir un resfro grave si consume cigarrillos, sufre una enfermedad cardaca (como insuficiencia cardaca) o pulmonar crnica (como asma) o si tiene un debilitamiento del sistema inmunolgico. Las personas muy jvenes o muy mayores tienen riesgo de sufrir infecciones ms graves. La sinusitis bacteriana, las infecciones del odo medio y la neumona bacteriana pueden complicar el resfro comn. El resfro puede exacerbar el asma y la enfermedad pulmonar obstructiva crnica. En algunos casos estas complicaciones requieren la atencin en un servicio de emergencias y pueden poner en peligro la vida. PREVENCIN La mejor manera de protegerse para no contraer un resfro es Pharmacologist una buena higiene. Evite el contacto bucal o de las manos con personas con sntomas de resfro. Si se produce el contacto, lvese las manos con frecuencia. No hay pruebas firmes que indiquen que la vitamina C, la vitamina E, la equincea o la actividad fsica reduzcan las posibilidades de [employer]  infeccin. Sin embargo, siempre se recomienda Insurance account manager y Winferd Humphrey buena nutricin. TRATAMIENTO El tratamiento est dirigido a Consulting civil engineer sntomas. Esta enfermedad no tiene [place]. Los antibiticos no son eficaces, ya que esta infeccin la causa  un virus y no una bacteria. El tratamiento incluye:  Aumente la ingesta de lquidos. Consumo de bebidas deportivas, que proporcionan electrolitos,azcares e hidratacin.  Inhale vapor caliente (de un vaporizador o de la ducha).  Tomar sopa de pollo u otros lquidos claros, y [employer] buena nutricin.  Descanse lo suficiente.  Haga grgaras o coma pastillas para [employer].  Control de la fiebre con ibuprofeno o acetaminofen, segn las indicaciones del mdico.  Aumento del uso del inhalador, si sufre asma. Las pastillas y los geles de zinc durante las primeras 24 horas de iniciado el resfro comn, pueden disminuir la duracin y Paramedic la gravedad de los sntomas. Los medicamentos para Chief Technology Officer pueden disminuir la fiebre, Paramedic los dolores musculares y Chief Technology Officer de Advertising copywriter. Se dispone de una gran variedad de medicamentos de venta libre para tratar la congestin y la secrecin nasal. El profesional podr recomendarle inhalantes para los otros sntomas. INSTRUCCIONES PARA EL CUIDADO DOMICILIARIO  Utilice los medicamentos de venta libre o de prescripcin para Chief Technology Officer, el malestar o la [employer], segn se lo indique el profesional que lo asiste.  Utilice un vaporizador caliente o inhale vapor, haciendo salir agua de la ducha para aumentar la humedad [place]. Esto mantendr las secreciones hmedas y Community education officer ms fcil respirar.  Beba gran cantidad de lquido para mantener la orina de tono claro o color amarillo plido.  Descanse todo lo que pueda.  Regrese a su trabajo cuando la temperatura se haya normalizado, o cuando el profesional que lo asiste se lo indique. Quizs sea necesario que permanezca en su casa durante un tiempo ms prolongado para Buyer, retail a Economist. Tambin puede utilizar un barbijo y ser cuidadoso con el lavado de manos para evitar la diseminacin del virus. SOLICITE ATENCIN MDICA SI:  Luego de los primeros das siente que empeora en vez  de [place].  Necesita que Occupational psychologist brinde ms informacin relacionada con los medicamentos para [employer] sntomas.  Siente escalofros, le falta el aire o escupe moco de color marrn o rojo. Estos pueden ser sntomas de neumona.  Tiene una secrecin nasal de color amarillo o marrn, o siente dolor en el rostro, especialmente cuando se inclina hacia adelante. Estos pueden ser sntomas de sinusitis.  Tiene fiebre, siente el cuello hinchado, tiene dolor al tragar u observa manchas blancas en el fondo de la garganta. Estos pueden ser sntomas de angina por estreptococo. Romona Curls ATENCIN MDICA DE INMEDIATO SI:  Lance Muss.  Comienza a sentir Herbalist de cabeza intenso o persistente, dolor de odos, en el seno nasal o en el pecho.  Tiene tos y esta se prolonga demasiado, tose y escupe sangre, la mucosidad habitual se modifica (si tiene una enfermedad pulmonar crnica) o respira con dificultad.  Siente rigidez en el cuello o dolor de cabeza intenso. Document Released: 09/01/2005 Document Revised: 02/14/2012 [facility] Patient Information 2014 [place], [place].

## 2013-11-10 ENCOUNTER — Ambulatory Visit (INDEPENDENT_AMBULATORY_CARE_PROVIDER_SITE_OTHER): Payer: BC Managed Care – PPO | Admitting: Family Medicine

## 2013-11-10 VITALS — BP 108/58 | HR 83 | Temp 98.1°F | Resp 16 | Ht 63.25 in | Wt 151.4 lb

## 2013-11-10 DIAGNOSIS — R05 Cough: Secondary | ICD-10-CM

## 2013-11-10 DIAGNOSIS — J209 Acute bronchitis, unspecified: Secondary | ICD-10-CM

## 2013-11-10 DIAGNOSIS — J069 Acute upper respiratory infection, unspecified: Secondary | ICD-10-CM

## 2013-11-10 LAB — POCT CBC
Granulocyte percent: 51.7 %G (ref 37–80)
Hemoglobin: 15.1 g/dL (ref 14.1–18.1)
MCH, POC: 30.9 pg (ref 27–31.2)
MCV: 98.7 fL — AB (ref 80–97)
RBC: 4.88 M/uL (ref 4.69–6.13)
WBC: 5.4 10*3/uL (ref 4.6–10.2)

## 2013-11-10 LAB — POCT INFLUENZA A/B
Influenza A, POC: NEGATIVE
Influenza B, POC: NEGATIVE

## 2013-11-10 MED ORDER — FIRST-DUKES MOUTHWASH MT SUSP: 5.0000 mL | OROMUCOSAL | Status: DC | PRN

## 2013-11-10 MED ORDER — IPRATROPIUM BROMIDE 0.03 % NA SOLN: 2.0000 | Freq: Four times a day (QID) | NASAL | Status: DC

## 2013-11-10 NOTE — Patient Instructions (Signed)
Infeccin de las vas areas superiores en los adultos (Upper Respiratory Infection, Adult)  La infeccin de las vas areas superiores tambin se conoce como resfro comn. La causa es un tipo de germen (virus). Los resfros se diseminan fcilmente (son contagiosos). Puede transmitirlo a los dems al besar, toser, estornudar o beber en el mismo vaso. Generalmente se cura en 1 a 2 semanas.  CUIDADOS EN EL HOGAR   Tome la medicacin segn las indicaciones.  Use un humidificador caliente o respire el vapor en una ducha caliente.  Beba gran cantidad de lquido para mantener la orina de tono claro o color amarillo plido.  Debe hacer reposo.  Regrese a su trabajo cuando la temperatura se haya normalizado, o cuando el mdico se lo indique. Puede usar un barbijo y Customer service manager las manos para evitar que el resfro se contagie. SOLICITE AYUDA DE INMEDIATO SI:   Luego de los primeros das siente que empeora en vez de [place].  Tiene dudas relacionadas con los medicamentos.  Siente escalofros, le falta el aire o escupe moco de color marrn o rojo.  Tiene una secrecin nasal de color amarillo o marrn, o siente dolor en el rostro, especialmente cuando se inclina hacia adelante.  Tiene fiebre, siente el cuello hinchado, tiene dolor al tragar u observa manchas blancas en el fondo de la garganta.  Comienza a sentir Herbalist de cabeza intenso o persistente, dolor de odos, en el seno nasal o en el pecho.  Al respirar emite un sonido similar a un silbido (sibilancias).  Siente falta de aire o escupe sangre al toser.  Tiene dolores musculares o rigidez en el cuello. ASEGRESE DE QUE:   Comprende estas instrucciones.  Controlar su enfermedad.  Solicitar ayuda de inmediato si no mejora o si empeora. Document Released: 04/26/2011 Document Revised: 02/14/2012 [facility] Patient Information 2014 [place], [place]. Bronquitis (Bronchitis) La bronquitis es [employer] (el modo que tiene el  organismo de Publishing rights manager a una lesin o infeccin) de los bronquios Los bronquios son los conductos que se extienden desde la trquea [employer]. Si la inflamacin se agrava, puede causar la falta de aire. CAUSAS Las causas de la inflamacin pueden ser:  Un virus  Grmenes (bacteria).  Polvo  Alergenos  La polucin y muchos otros irritantes Las clulas que revisten el rbol bronquial estn cubiertas con pequeos pelos (cilias). Esta constantemente producen un movimiento desde los pulmones hacia la boca. De este modo se mantienen los pulmones libres de polucin. Cuando estas clulas se irritan y no pueden cumplir su funcin, comienza a formarse la mucosidad. Esto produce la caracterstica tos de la bronquitis. La tos es el mecanismo por el cual se limpian los pulmones cuando las cilias no pueden cumplir su funcin. Sin alguno de [employer], Animator se Psychologist, clinical los pulmones Entonces se desarrollara una pulmona.  El fumar es una de las causas ms frecuentes de bronquitis y puede contribuir a la neumona. Abandonar este hbito es lo ms importante que puede hacer para beneficiarse. TRATAMIENTO  El [employer] prescribir antibiticos si la causa es una bacteria, y medicamentos para abrir las vas areas y Personnel officer. Tambin puede recomendar o prescribir un expectorante. El expectorante aflojar la mucosidad para que pueda eliminarla. Slo tome medicamentos de Sales promotion account executive o prescriptos para Primary school teacher, las [place], o bajar la fiebre segn las indicaciones de su mdico.  Pharmacologist todo lo que causa el problema (por ejemplo el hbito de Art therapist) es fundamental para evitar que empeore.  Un antitusgeno puede prescribirse para [employer] de la tos.  Podrn indicarle inhalantes para aliviar los sntomas actuales y ayudar a prevenir problemas futuros.  Aquellos que sufren bronquitis crnica (recurrente) puede ser necesaria la administracin  de corticoides. SOLICITE ATENCIN MDICA INMEDIATAMENTE SI:  Durante el tratamiento observa que elimina esputo similar a pus (purulento).  Tiene fiebre.  Se siente cada vez ms enfermo.  Tiene cada vez ms dificultad para respirar, tiene ruidos al respirar o Company secretary. Es necesario buscar atencin mdica inmediata si es [place] persona de edad avanzada o sufre alguna otra enfermedad. ASEGURESE DE QUE:   Comprende estas instrucciones.  Controlar su enfermedad.  Solicitar ayuda inmediatamente si no mejora o si empeora. Document Released: 11/22/2005 Document Revised: 07/25/2013 [facility] Patient Information 2014 [place], [place]. Bronquitis aguda ( Acute Bronchitis) La bronquitis es una inflamacin de las vas respiratorias que se extienden desde la trquea [employer] pulmones (bronquios). La inflamacin produce la formacin de mucosidad. Esto produce tos, que es el sntoma ms frecuente de la bronquitis.  Cuando la bronquitis es [place], generalmente comienza de [place] sbita y desaparece luego de un par de semanas. El hbito de fumar, las alergias y el asma pueden empeorar la bronquitis. Los episodios repetidos de bronquitis pueden causar ms problemas pulmonares.  CAUSAS La causa ms frecuente de bronquitis aguda es el mismo virus que produce el resfro. El virus se propaga de persona a persona (contagioso).  SIGNOS Y SNTOMAS   Tos.   Grant Ruts.   Tos con mucosidad.   Dolores [employer]epsi[employer] cuerpo.   Congestin en el pecho.   Escalofros.   Falta de aire.   Dolor de Advertising copywriter.  DIAGNSTICO  La bronquitis aguda en general se diagnostica con un examen fsico. En algunos casos se indican otros estudios, como radiografas, para Risk manager.  TRATAMIENTO  La bronquitis aguda generalmente desaparece en un par de semanas. Con frecuencia no es Quarry manager. Los medicamentos se indican para aliviar la fiebre o la tos. Generalmente no es necesario  el uso de antibiticos, Biomedical engineer pueden indicarse en ciertas ocasiones. En algunos casos, se recomienda el uso de un inhalador para mejorar la falta de aire y Scientist, physiological tos. Un vaporizador de aire fro podr ayudarlo a [employer] bronquiales y Statistician su eliminacin.  INSTRUCCIONES PARA EL CUIDADO EN EL HOGAR  Descanse lo suficiente.   Beba lquidos en abundancia para mantener la orina de color claro o amarillo plido (excepto que padezca una enfermedad que requiera la restriccin de lquidos). Tome mucho lquido para Restaurant manager, fast food las secreciones y Statistician.   Tome slo medicamentos de venta libre o recetados, segn las indicaciones del mdico.   Evite fumar o aspirar el humo de otros fumadores. La exposicin al humo del cigarrillo o a irritantes qumicos har que la bronquitis empeore. Si fuma, considere el uso de goma de Theatre manager o la aplicacin de parches en la piel que contengan nicotina para Paramedic los sntomas de abstinencia. Si deja de fumar, sus pulmones se curarn ms rpido.   Reduzca la probabilidad de otro brote de bronquitis aguda lavando sus manos con frecuencia, evitando a las personas que tengan sntomas y tratando de no tocarse las manos con la boca, la nariz o los ojos.   Concurra a las consultas de control con su mdico segn las indicaciones.  SOLICITE ATENCIN MDICA SI: Los sntomas no mejoran despus de 1 semana de tratamiento.  SOLICITE ATENCIN MDICA DE INMEDIATO SI:  Comienza a tener fiebre o escalofros cada vez ms intensos.   Siente dolor en el pecho.   Le falta el aire de manera preocupante.  La flema tiene [place].   Se deshidrata.  Se desmaya.  Tiene vmitos que se repiten.  Tiene un dolor de cabeza intenso. ASEGRESE DE QUE:   Comprende estas instrucciones.  Controlar su afeccin.  Recibir ayuda de inmediato si no mejora o si empeora. Document Released: 11/22/2005 Document Revised: 07/25/2013 [facility]  Patient Information 2014 [place], [place].

## 2013-11-10 NOTE — Progress Notes (Signed)
Subjective:  This chart was scribed for Norberto Sorenson, MD by Arlan Organ, ED Scribe. This patient was seen in room Room/bed 8 and the patient's care was started 3:47 PM.    Patient ID: Earl Meyer, male    DOB: February 12, 1976, 37 y.o.   MRN: [id number]  Chief Complaint  Patient presents with   Sore Throat    X 1 week   Sinus Congestion    X 1 week   HPI  HPI Comments: Earl Meyer is a 37 y.o. male who presents to [facility] complaining of a sore throat and sinus congestion that started 1 week ago. He reports yellow/green nasal discharge. Pt saw Dr. Alwyn Ren 8 days ago with URI and cough thought to be viral, and was put on symptomatic medications. Pt says the prescribed medication is currently resolving the cough, but he says he is still experiencing congestion, chills, and HA. Pt says he recently stopped using the prescribed cough medications due to increased thirst, dry mouth, and flank numbness he associates with the medication. He has tried ibuprofen with mild relief for his chills. Pt has been using saline solution with relief. He has tried steamed showers for chest congestion with relief. He says he has gargled warm salt water with no relief. Pt also reports having a humidifier that he uses periodically. He denies fever.  Past Medical History  Diagnosis Date   Allergy      Allergies  Allergen Reactions   Penicillins    Current Outpatient Prescriptions on File Prior to Visit  Medication Sig Dispense Refill   HYDROcodone-homatropine (HYCODAN) 5-1.5 MG/5ML syrup Take 5 mLs by mouth every 4 (four) hours as needed for cough.  120 mL  0   benzonatate (TESSALON) 100 MG capsule Take 1-2 capsules (100-200 mg total) by mouth 3 (three) times daily as needed for cough.  40 capsule  0   No current facility-administered medications on file prior to visit.     Review of Systems  Constitutional: Positive for chills. Negative for fever and diaphoresis.  HENT: Positive  for congestion, rhinorrhea and sore throat. Negative for ear pain and sinus pressure.   Respiratory: Negative for cough.   Musculoskeletal: Negative for arthralgias, joint swelling and myalgias.  Skin: Negative for rash.  Neurological: Positive for headaches. Negative for light-headedness.    BP 108/58   Pulse 83   Temp(Src) 98.1 F (36.7 C) (Oral)   Resp 16   Ht 5' 3.25" (1.607 m)   Wt 151 lb 6.4 oz (68.675 kg)   BMI 26.59 kg/m2   SpO2 98% Objective:  Physical Exam  Nursing note and vitals reviewed. Constitutional: He is oriented to person, place, and time. He appears well-developed and well-nourished.  HENT:  Head: Normocephalic and atraumatic.  Right Ear: Hearing and external ear normal. Tympanic membrane is injected. A middle ear effusion is present.  Left Ear: Hearing and external ear normal. Tympanic membrane is injected. A middle ear effusion is present.  Nose: Rhinorrhea present.  Mouth/Throat: Uvula is midline and oropharynx is clear and moist. No oropharyngeal exudate, posterior oropharyngeal edema or posterior oropharyngeal erythema.  Eyes: EOM are normal.  Neck: Normal range of motion.  Cardiovascular: Normal rate.   Pulmonary/Chest: Effort normal. No accessory muscle usage. No respiratory distress. He has no decreased breath sounds. He has no wheezes. He has no rhonchi. He has no rales.  Anterior respiratory rhonchi on the left heard on first inspiration but all breath sounds resolved w/ continued inspiration  Musculoskeletal:  Normal range of motion.  Neurological: He is alert and oriented to person, place, and time.  Skin: Skin is warm and dry.  Psychiatric: He has a normal mood and affect. His behavior is normal.   Results for orders placed in visit on 11/10/13  POCT INFLUENZA A/B      Result Value Range   Influenza A, POC Negative     Influenza B, POC Negative    POCT CBC      Result Value Range   WBC 5.4  4.6 - 10.2 K/uL   Lymph, poc 1.9  0.6 - 3.4   POC LYMPH  PERCENT 35.8  10 - 50 %L   MID (cbc) 0.7  0 - 0.9   POC MID % 12.5 (*) 0 - 12 %M   POC Granulocyte 2.8  2 - 6.9   Granulocyte percent 51.7  37 - 80 %G   RBC 4.88  4.69 - 6.13 M/uL   Hemoglobin 15.1  14.1 - 18.1 g/dL   HCT, POC 16.1  09.6 - 53.7 %   MCV 98.7 (*) 80 - 97 fL   MCH, POC 30.9  27 - 31.2 pg   MCHC 31.3 (*) 31.8 - 35.4 g/dL   RDW, POC 04.5     Platelet Count, POC 279  142 - 424 K/uL   MPV 8.4  0 - 99.8 fL    Assessment & Plan:  Cough - Plan: POCT Influenza A/B, POCT CBC  Acute URI  Acute bronchitis - Spoke to patient regarding negative influenza lab results, but explained to patient a viral infection is present. Encouraged patient to take hot steam showers, and to continue with the steps he has previously been trying to get better as these are likely to work considering his normal labs.  Advised patient to come back if symptoms do not resolve, or worsen as next step would be cxr and cons abx.  Meds ordered this encounter  Medications   Diphenhyd-Hydrocort-Nystatin (FIRST-DUKES MOUTHWASH) SUSP    Sig: Use as directed 5 mLs in the mouth or throat every 2 (two) hours as needed (sore throat).    Dispense:  237 mL    Refill:  0   ipratropium (ATROVENT) 0.03 % nasal spray    Sig: Place 2 sprays into the nose 4 (four) times daily.    Dispense:  30 mL    Refill:  1    I personally performed the services described in this documentation, which was scribed in my presence. The recorded information has been reviewed and considered, and addended by me as needed.  Norberto Sorenson, MD MPH

## 2013-12-05 ENCOUNTER — Ambulatory Visit (INDEPENDENT_AMBULATORY_CARE_PROVIDER_SITE_OTHER): Payer: BC Managed Care – PPO | Admitting: Emergency Medicine

## 2013-12-05 ENCOUNTER — Ambulatory Visit: Payer: BC Managed Care – PPO

## 2013-12-05 VITALS — BP 110/68 | HR 54 | Temp 97.3°F | Resp 18 | Ht 63.25 in | Wt 149.0 lb

## 2013-12-05 DIAGNOSIS — M25569 Pain in unspecified knee: Secondary | ICD-10-CM

## 2013-12-05 DIAGNOSIS — M25562 Pain in left knee: Secondary | ICD-10-CM

## 2013-12-05 MED ORDER — MELOXICAM 7.5 MG PO TABS: ORAL_TABLET | ORAL | Status: DC

## 2013-12-05 NOTE — Progress Notes (Signed)
   Subjective:    Patient ID: Earl Meyer, male    DOB: [birth year], 37 y.o.   MRN: [id number]  HPI Scribed for Earl Chris MD, the patient was seen in room 11. This chart was scribed by Lewanda Rife, ED scribe. Patient's care was started at 4:46 PM  HPI Comments: Hx provided by the pt and spanish interpreter present. Earl Meyer is a 37 y.o. male who presents to the [facility] complaining of constant unchanged left knee pain onset 8 days with no known mechanism of injury. States he has to climb often at work and wears heavy work boots. Describes pain as moderate in severity. Reports associated mild weakness. Reports pain is exacerbated with flexion, full extension and weight bearing. Reports pain is mildly alleviated at rest.  Denies associated recent injury.   Denies PMHx of left knee pain.   Past Medical History  Diagnosis Date  . Allergy     History reviewed. No pertinent past surgical history.  Family History  Problem Relation Age of Onset  . Cancer Father     History   Social History  . Marital Status: Married    Spouse Name: N/A    Number of Children: N/A  . Years of Education: N/A   Occupational History  . Not on file.   Social History Main Topics  . Smoking status: Never Smoker   . Smokeless tobacco: Not on file  . Alcohol Use: No  . Drug Use: No  . Sexual Activity: Yes    Birth Control/ Protection: None   Other Topics Concern  . Not on file   Social History Narrative  . No narrative on file    Allergies  Allergen Reactions  . Penicillins   . Tessalon [Benzonatate] Dermatitis    Abdomen numb and severe dry mouth    There are no active problems to display for this patient.      Review of Systems     Objective:   Physical Exam   COORDINATION OF CARE:  Nursing notes reviewed. Vital signs reviewed. Initial pt interview and examination performed.   4:57 PM-Discussed work up plan with  pt at bedside, which includes x-ray of left knee. Pt agrees with plan.   Treatment plan initiated:Medications - No data to display   Initial diagnostic testing ordered.     Physical Exam  Nursing note and vitals reviewed. Constitutional: He is oriented to person, place, and time. He appears well-developed and well-nourished. No distress.  HENT:  Head: Normocephalic and atraumatic.  Eyes: EOM are normal.  Neck: Neck supple. No tracheal deviation present.  Cardiovascular: Normal rate.   Pulmonary/Chest: Effort normal. No respiratory distress.  Musculoskeletal: Decreased ROM of left knee. Lacks 15 degrees of full extension. Negative drawer test. Positive McMurray's. Neurovascularly intact. Intact distal pulses. TTP of medial left knee with mild effusion noted.   Neurological: He is alert and oriented to person, place, and time.  Skin: Skin is warm and dry.  Psychiatric: He has a normal mood and affect. His behavior is normal.   [facility] reading (PRIMARY) by  Dr. 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 He is very tender over the medial joint space. We'll treat with meloxicam and see if we can get an MRI of his knee.

## 2013-12-05 NOTE — Patient Instructions (Signed)
Try and stay off of the knee. Apply ice for short periods of time for knee discomfort take her anti-inflammatory as instructed. We're going to schedule you for an MRI of your knee

## 2013-12-14 ENCOUNTER — Other Ambulatory Visit: Payer: Self-pay

## 2014-02-17 ENCOUNTER — Ambulatory Visit (INDEPENDENT_AMBULATORY_CARE_PROVIDER_SITE_OTHER): Payer: BC Managed Care – PPO | Admitting: Family Medicine

## 2014-02-17 VITALS — BP 100/70 | HR 100 | Temp 98.4°F | Resp 18 | Ht 63.0 in | Wt 148.0 lb

## 2014-02-17 DIAGNOSIS — R05 Cough: Secondary | ICD-10-CM

## 2014-02-17 DIAGNOSIS — J029 Acute pharyngitis, unspecified: Secondary | ICD-10-CM

## 2014-02-17 DIAGNOSIS — R059 Cough, unspecified: Secondary | ICD-10-CM

## 2014-02-17 MED ORDER — HYDROCODONE-HOMATROPINE 5-1.5 MG/5ML PO SYRP: 5.0000 mL | ORAL_SOLUTION | Freq: Three times a day (TID) | ORAL | Status: DC | PRN

## 2014-02-17 MED ORDER — AZITHROMYCIN 250 MG PO TABS: ORAL_TABLET | ORAL | Status: DC

## 2014-02-17 NOTE — Progress Notes (Signed)
° °  Subjective:    Patient ID: Earl Meyer, male    DOB: 04/28/1976, 38 y.o.   MRN: [id number]  This chart was scribed for Elvina SidleKurt Lauenstein, MD, by Yevette EdwardsAngela Bracken, scribe. The pt's care was started at 5:20 PM.   Chief Complaint  Patient presents with   Otalgia    since yesterday    Cough   Sore Throat   Headache   Chills    HPI  Earl Meyer is a 38 y.o. male who presents to [facility] complaining of a sore throat which began yesterday and has been accompanied by chills, otalgia, itchy eyes, a cough, and a post-tussive headache. In the office, his temperature is 98.4 F.   The pt is a Education administratorpainter by trade.   He has a 33seven month old daughter and an 39 year-old son.   He is allergic to penicillin and tessalon.   Past Medical History  Diagnosis Date   Allergy    History reviewed. No pertinent past surgical history.  Current Outpatient Prescriptions on File Prior to Visit  Medication Sig Dispense Refill   Diphenhyd-Hydrocort-Nystatin (FIRST-DUKES MOUTHWASH) SUSP Use as directed 5 mLs in the mouth or throat every 2 (two) hours as needed (sore throat).  237 mL  0   HYDROcodone-homatropine (HYCODAN) 5-1.5 MG/5ML syrup Take 5 mLs by mouth every 4 (four) hours as needed for cough.  120 mL  0   ipratropium (ATROVENT) 0.03 % nasal spray Place 2 sprays into the nose 4 (four) times daily.  30 mL  1   meloxicam (MOBIC) 7.5 MG tablet 1-2 tablets daily for knee pain  30 tablet  0   Social History   Marital Status: Married   Social History Main Topics   Smoking status: Never Smoker    Smokeless tobacco: Not on file   Alcohol Use: No   Drug Use: No   Sexual Activity: Yes    Pharmacist, hospitalBirth Control/ Protection: None   Review of Systems  Constitutional: Positive for chills. Negative for fever.  HENT: Positive for ear pain and sore throat.   Eyes: Positive for itching.  Respiratory: Positive for cough.   Neurological: Positive for headaches.        Objective:   Physical Exam  Nursing note and vitals reviewed. Constitutional: He is oriented to person, place, and time. He appears well-developed and well-nourished. No distress.  HENT:  Head: Normocephalic and atraumatic.  Eyes: EOM are normal.  Neck: Neck supple. No tracheal deviation present.  Cardiovascular: Normal rate.   Pulmonary/Chest: Effort normal. No respiratory distress.  Musculoskeletal: Normal range of motion.  Neurological: He is alert and oriented to person, place, and time.  Skin: Skin is warm and dry.  Psychiatric: He has a normal mood and affect. His behavior is normal.   no cervical adenopathy, neck supple Vitals: BP 100/70   Pulse 100   Temp(Src) 98.4 F (36.9 C) (Oral)   Resp 18   Ht 5\' 3"  (1.6 m)   Wt 148 lb (67.132 kg)   BMI 26.22 kg/m2   SpO2 98% Chest clear    Assessment & Plan:  Cough - Plan: azithromycin (ZITHROMAX Z-PAK) 250 MG tablet, HYDROcodone-homatropine (HYCODAN) 5-1.5 MG/5ML syrup  Sore throat - Plan: azithromycin (ZITHROMAX Z-PAK) 250 MG tablet, HYDROcodone-homatropine (HYCODAN) 5-1.5 MG/5ML syrup  Signed, Elvina SidleKurt Lauenstein, MD

## 2014-03-28 ENCOUNTER — Ambulatory Visit: Payer: Self-pay

## 2014-05-28 ENCOUNTER — Ambulatory Visit (INDEPENDENT_AMBULATORY_CARE_PROVIDER_SITE_OTHER): Payer: Self-pay | Admitting: Emergency Medicine

## 2014-05-28 VITALS — BP 128/86 | HR 86 | Temp 98.0°F | Resp 18 | Ht 64.0 in | Wt 147.4 lb

## 2014-05-28 DIAGNOSIS — J020 Streptococcal pharyngitis: Secondary | ICD-10-CM

## 2014-05-28 MED ORDER — AZITHROMYCIN 250 MG PO TABS
ORAL_TABLET | ORAL | Status: DC
Start: 2014-05-28 — End: 2014-05-30

## 2014-05-28 MED ORDER — ACETAMINOPHEN-CODEINE #3 300-30 MG PO TABS: 1.0000 | ORAL_TABLET | ORAL | Status: DC | PRN

## 2014-05-28 NOTE — Patient Instructions (Signed)
Angina por estreptococos °(Strep Throat) ° La angina estreptocóccica es una infección en la garganta causada por una bacteria llamada Streptococcus pyogenes. El médico la llamará "amigdalitis" o "faringitis" estreptocóccica, según haya signos de inflamación en las amígdalas o en la zona posterior de la garganta. Es más frecuente en niños entre los 5 y los 15 años durante los meses de frío, pero puede ocurrir a personas de cualquier edad. La infección se transmite de persona a persona (contagio) a través de la tos, el estornudo u otro contacto cercano.  °SÍNTOMAS °· Fiebre o escalofríos. °· La garganta o las amígdalas le duelen y están inflamadas. °· Dolor o dificultad para tragar. °· Puntos blancos o amarillos en las amígdalas o la garganta. °· Ganglios linfáticos hinchados a los lados del cuello o debajo de la mandíbula. °· Erupción rojiza en todo el cuerpo (poco común). °DIAGNÓSTICO °Diferentes infecciones pueden causar los mismos síntomas. Para confirmar el diagnóstico deberá hacerse análisis, y así podrán prescribirle el tratamiento adecuado. La "prueba rápida de estreptococo" ayudará al médico a hacer el diagnóstico en algunos minutos. Si no se dispone de la prueba, se hará un rápido hisopado de la zona afectada para hacer un cultivo de las secreciones de la garganta. Si se hace un cultivo, los resultados estarán disponibles en uno o dos días.  °TRATAMIENTO °El estreptococo de garganta se trata con antibióticos. °INSTRUCCIONES PARA EL CUIDADO DOMICILIARIO °· Haga gárgaras con 1 cucharadita de sal en 1 taza de agua tibia, 3 ó 4 veces por día o cuando lo crea necesario para sentirse mejor. °· Los miembros de la familia que también tengan dolor en la garganta deben ser evaluados y tratados con antibióticos si tienen la infección. °· Asegúrese de que todas las personas de su casa se laven bien las manos. °· No comparta alimentos, tazas ni utensilios personales que puedan contagiar la infección. °· Coma alimentos  blandos hasta que el dolor de garganta mejore. °· Beba gran cantidad de líquido para mantener la orina de tono claro o color amarillo pálido. Esto ayudará a prevenir la deshidratación. °· Debe hacer reposo. °· No concurra a la escuela o la trabajo hasta que haya tomado los antibióticos durante 24 horas. °· Utilice los medicamentos de venta libre o de prescripción para el dolor, el malestar o la fiebre, según se lo indique el profesional que lo asiste. °· Si le han recetado medicamentos, tómelos según las indicaciones. Finalice la prescripción completa, aunque se sienta mejor. °SOLICITE ATENCIÓN MÉDICA SI: °· Los ganglios del cuello siguen agrandados. °· Aparece una erupción cutánea, tos o dolor de oídos. °· Tiene un catarro verde, amarillo amarronado o esputo sanguinolento. °· Siente dolor o malestar que no puede controlar con los medicamentos. °· Los síntomas parecen empeorar en vez de mejorar. °SOLICITE ATENCIÓN MÉDICA DE INMEDIATO SI: °· Presenta algún nuevo síntoma, como vómitos, dolor de cabeza intenso, rigidez o dolor en el cuello, dolor en el pecho o problemas respiratorios o dificultad para tragar. °· Presenta dolor de garganta intenso, babeo o cambios en la voz. °· Siente que el cuello se hincha o la piel de esa zona se vuelve roja y sensible. °· Tiene fiebre. °· Tiene signos de deshidratación, como fatiga, boca seca y disminución de la orina. °· Comienza a sentir mucho sueño, o no puede despertarse bien. °Document Released: 09/01/2005 Document Revised: 11/08/2012 °ExitCare® Patient Information ©2015 [employer]. This information is not intended to replace advice given to you by your health care Earl Meyer. Make sure you discuss any questions   you have with your health care Finnley Larusso.

## 2014-05-28 NOTE — Progress Notes (Signed)
[facility] [address], [place] [id number] [phone]  Date:  05/28/2014   Name:  Earl LooseSalvador Meyer   DOB:  05/14/1976   MRN:  [id number]  PCP:  No PCP Per Patient    Chief Complaint: URI   History of Present Illness:  [name] is a 38 y.o. very pleasant male patient who presents with the following:  Ill since yesterday with sudden onset of sore throat, myalgias and fever.  Difficulty swallowing due pain.  No coryza or cough.  No wheezing or shortness no nausea or vomiting.  No stool change. No rash. No improvement with over the counter medications or other home remedies. Denies other complaint or health concern today.   There are no active problems to display for this patient.   Past Medical History  Diagnosis Date  . Allergy     No past surgical history on file.  History  Substance Use Topics  . Smoking status: Never Smoker   . Smokeless tobacco: Not on file  . Alcohol Use: No    Family History  Problem Relation Age of Onset  . Cancer Father     Allergies  Allergen Reactions  . Penicillins   . Tessalon [Benzonatate] Dermatitis    Abdomen numb and severe dry mouth    Medication list has been reviewed and updated.  Current Outpatient Prescriptions on File Prior to Visit  Medication Sig Dispense Refill  . Diphenhyd-Hydrocort-Nystatin (FIRST-DUKES MOUTHWASH) SUSP Use as directed 5 mLs in the mouth or throat every 2 (two) hours as needed (sore throat).  237 mL  0  . ipratropium (ATROVENT) 0.03 % nasal spray Place 2 sprays into the nose 4 (four) times daily.  30 mL  1  . HYDROcodone-homatropine (HYCODAN) 5-1.5 MG/5ML syrup Take 5 mLs by mouth every 8 (eight) hours as needed for cough.  120 mL  0  . meloxicam (MOBIC) 7.5 MG tablet 1-2 tablets daily for knee pain  30 tablet  0   No current facility-administered medications on file prior to visit.    Review of Systems:  As per HPI, otherwise negative.     Physical Examination: Filed Vitals:   05/28/14 0942  BP: 128/86  Pulse: 86  Temp: 98 F (36.7 C)  Resp: 18   Filed Vitals:   05/28/14 0942  Height: 5\' 4"  (1.626 m)  Weight: 147 lb 6.4 oz (66.86 kg)   Body mass index is 25.29 kg/(m^2). Ideal Body Weight: Weight in (lb) to have BMI = 25: 145.3   GEN: WDWN, NAD, Non-toxic, Alert & Oriented x 3 HEENT: Atraumatic, Normocephalic.  Beefy red oropharynx.  No exudate. No LAD Ears and Nose: No external deformity. TM negative Chest:  Clear, BS= EXTR: No clubbing/cyanosis/edema NEURO: Normal gait.  PSYCH: Normally interactive. Conversant. Not depressed or anxious appearing.  Calm demeanor.    Assessment and Plan: Strep pharyngitis Pen vk  Signed,  Phillips OdorJeffery Anderson, MD

## 2014-05-30 ENCOUNTER — Ambulatory Visit (INDEPENDENT_AMBULATORY_CARE_PROVIDER_SITE_OTHER): Payer: Self-pay | Admitting: Family Medicine

## 2014-05-30 VITALS — BP 112/68 | HR 69 | Temp 97.7°F | Resp 16 | Ht 63.0 in | Wt 147.8 lb

## 2014-05-30 DIAGNOSIS — J029 Acute pharyngitis, unspecified: Secondary | ICD-10-CM

## 2014-05-30 DIAGNOSIS — R110 Nausea: Secondary | ICD-10-CM

## 2014-05-30 DIAGNOSIS — D72829 Elevated white blood cell count, unspecified: Secondary | ICD-10-CM

## 2014-05-30 DIAGNOSIS — R51 Headache: Secondary | ICD-10-CM

## 2014-05-30 DIAGNOSIS — B349 Viral infection, unspecified: Secondary | ICD-10-CM

## 2014-05-30 LAB — CBC WITH DIFFERENTIAL/PLATELET
HCT: 45.1 % (ref 39.0–52.0)
Hemoglobin: 15.3 g/dL (ref 13.0–17.0)
LYMPHS PCT: 5 % — AB (ref 12–46)
Lymphs Abs: 0.7 10*3/uL (ref 0.7–4.0)
MCH: 32.3 pg (ref 26.0–34.0)
MCHC: 33.9 g/dL (ref 30.0–36.0)
MCV: 95.4 fL (ref 78.0–100.0)
Monocytes Absolute: 0.1 10*3/uL (ref 0.1–1.0)
Monocytes Relative: 1 % — ABNORMAL LOW (ref 3–12)
Neutro Abs: 12.5 10*3/uL — ABNORMAL HIGH (ref 1.7–7.7)
Neutrophils Relative %: 94 % — ABNORMAL HIGH (ref 43–77)
PLATELETS: 229 10*3/uL (ref 150–400)
RBC: 4.73 MIL/uL (ref 4.22–5.81)
RDW: 12.8 % (ref 11.5–15.5)
WBC: 13.3 10*3/uL — AB (ref 4.0–10.5)

## 2014-05-30 MED ORDER — HYDROCODONE-ACETAMINOPHEN 5-325 MG PO TABS: 1.0000 | ORAL_TABLET | Freq: Four times a day (QID) | ORAL | Status: DC | PRN

## 2014-05-30 MED ORDER — CLINDAMYCIN HCL 300 MG PO CAPS: 300.0000 mg | ORAL_CAPSULE | Freq: Three times a day (TID) | ORAL | Status: DC

## 2014-05-30 MED ORDER — ONDANSETRON 4 MG PO TBDP: 4.0000 mg | ORAL_TABLET | Freq: Three times a day (TID) | ORAL | Status: DC | PRN

## 2014-05-30 NOTE — Patient Instructions (Signed)
Cambia medicina por dolor.  Continua antibiotica, pero es posible tiene un virus - similar a su hija.  Resulta con sangre con telfono en proximo 5-6 horas. por nausea - nueva medicina si necesario. bebe liquidos.  si no esta mejor en 2-3 dias, o peorse antes - regrese aqui o cuarto de Associate Professoremergencia.   Dolor de Training and development officercabeza, preguntas frecuentes y sus respuestas (Headaches, Frequently Asked Questions) CEFALEAS MIGRAOSAS P: Qu es la migraa? Qu la ocasiona? Cmo puedo tratarla? R: En general, la migraa comienza como un dolor apagado. Luego progresa hacia un dolor, constante, punzante y como un latido. Sentir Scientist, physiologicaleste dolor en las sienes. Podr sentir Radiographer, therapeuticdolor en la parte anterior o posterior de la cabeza, o en uno o ambos lados. El dolor suele estar acompaado de una combinacin de:  Nuseas.  Vmitos.  Sensibilidad a la luz y los ruidos. Algunas personas (un 15%) experimentan un aura (ver abajo) antes de un ataque. La causa de la migraa se debe a reacciones qumicas del cerebro. El tratamiento para la migraa puede incluir medicamentos de [place]venta libre. Tambin puede incluir tcnicas de Peruautoayuda. Estas incluyen entrenamientos para la relajacin y biorretroalimentacin.  P: Qu es un aura? R: Alrededor del 15% de las personas con migraa tiene un "aura". Es una seal de sntomas neurolgicos que ocurren antes de un dolor de cabeza por migraas. Podr ver lneas onduladas o irregulares, puntos o luces parpadeantes. Podr experimentar visin de tnel o puntos ciegos en uno o ambos ojos. El aura puede incluir alucinaciones visuales o auditivas (algo que se imagina). Puede incluir trastornos en el olfato (como olores extraos), el tacto o el gusto. Entre otros sntomas se incluyen:  Adormecimiento.  Sensacin de hormigueo.  Dificultad para recordar o Occupational hygienistdecir la palabra correcta. Estos episodios neurolgicos pueden durar hasta 60 minutos. Los sntomas desaparecern a medida que el dolor de cabeza  comience. P:Qu es un disparador? R: Ciertos factores fsicos o Sports administratorambientales pueden llevar a "disparar" una migraa. Estos son:  Alimentos.  Cambios hormonales.  Clima.  Estrs. Es importante recordar que los disparadores son diferentes entre si. Para ayudar a prevenir ataques de migraas, necesitar descubrir cules son los Psychologist, prison and probation servicesdisparadores que le afectan. Lleve un diario sobre sus dolores de Turkmenistancabeza. Este es un buen modo para descubrir los disparadores. El Sales executivediario le ayudar en el momento de hablar con el profesional acerca de su enfermedad. P: El clima afecta en las migraas? R: La luz solar, el calor, la humedad y lo cambios drsticos en la presin Software engineerbaromtrica pueden llevar a, o "disparar" un ataque de migraa en Time Warneralgunas personas. Pero estudios han demostrado que el clima no acta como disparador para todas las personas con Elbertamigraa. P: Cul es la relacin entre la migraa y la hormonas? R: Las hormonas inician y regulan muchas de las funciones corporales. Las hormonas Bank of New York Companymantienen el balance en el cuerpo dentro de los constantes cambios de Hartvilleambiente. Algunas veces, el nivel de hormonas en el cuerpo se desbalancea. Por ejemplo, durante la menstruacin, el embarazo o la Harrisburgmenopausia. Pueden ser la causa de un ataque de migraa. De hecho, alrededor de tres cuartos de las mujeres con migraa informan que sus ataques estn relacionados con el ciclo menstrual.  P: Aumenta el riesgo de sufrir un choque cardaco en las personas que padecen migraa? R: La probabilidad de que un ataque de migraa ocasione un ataque cardaco es muy remota. Esto no quiere Limited Brandsdecir que una persona que sufre de migraa no pueda tener un ataque cardaco asociado con ella. En  las personas menores de 40 aos, el factor ms comn para un ataque es la Argylemigraa. Pero durante la vida de una persona, la ocurrencia de un dolor de cabeza por migraa est asociada con una reduccin en el riesgo de morir por un ataque cerebrovascular.  P: Cules  son los medicamentos para la migraa? R: La medicacin precisa se Coc[place] ([place]) Islandsutiliza para tratar el dolor de cabeza una vez que ha comenzado. Son ejemplos, medicamentos de Gypsumventa libre, desinflamatorios sin esteroides, ergotamnicos y triptanos.  P: Qu son los triptanos? R: Lo triptanos son Yaakov Guthrieuna nueva clase de medicamentos abortivos. Son especficos para tratar este problema. Los triptanos son vasoconstrictores. Moderan algunas reacciones qumicas del cerebro. Los triptanos trabajan como receptores del cerebro. Ayudan a Warehouse managerrestaurar el balance de un neurotransmisor denominado serotonina. Se cree que las fluctuaciones en los Knottsvilleniveles de serotonina son la causa principal de la migraa.  P: Son Colgate-Palmoliveefectivos los medicamentos de venta libre para la migraa? R: Los medicamentos de H. J. Heinzventa libre pueden ser efectivos para Paramedicaliviar dolores leves a moderados y los sntomas asociados a la Germantownmigraa. Pero deber consultar a un mdico antes de Charity fundraisercomenzar cualquier tratamiento para la migraa.  P: Cules son los medicamentos de prevencin de la migraa? R: Se suele denominar tratamiento "profilctico" a los medicamentos para la prevencin de la migraa. Se utilizan para reducir Print production plannerla frecuencia, gravedad y duracin de los ataques de Minklermigraa. Son ejemplos de medicamentos de prevencin: antiepilpticos, antidepresivos, bloqueadores beta, bloqueadores de los canales de calcio y medicamentos antiinflamatorios sin esteroides. P:  Por qu se utilizan anticonvulsivantes para tratar la migraa? R: Durante los ltimos aos, ha habido un creciente inters en las drogas antiepilpticas para la prevencin de la migraa. A menudo se los conoce como "anticonvulsivantes". La epilepsia y la migraa suceden por reacciones similares en el cerebro.  P:  Por qu se utilizan antidepresivos para tratar la migraa? R: Los antidepresivos tpicamente se Lao People's Democratic Republicutilizan para tratar a las personas con depresin. Pueden reducir la frecuencia de la migraa a travs de la  regulacin de los niveles qumicos, como la serotonina, en el cerebro.  P:  Por qu se utilizan terapias alternativas para tratar la migraa? R: El trmino "terapias alternativas" suelen utilizarse para describir los tratamientos que se considera que estn por fuera de Occupational hygienistalcance la medicina occidental convencional. Son ejemplos de las terapias alternativas: la acupuntura, la acupresin y el yoga. Otra terapia alternativa comn es la terapia herbal. Se cree que algunas hierbas ayudan a Corning Incorporatedaliviar los dolores de Turkmenistancabeza. Siempre consulte con Bed Bath & Beyondel profesional acerca de las terapias alternativas antes de Ianthautilizarlas. Algunos productos herbales contienen arsnico y Bloomvilleotras toxinas. DOLORES DE CABEZA POR TENSIN P: Qu es un dolor de cabeza por tensin? Qu lo ocasiona? Cmo puedo tratarlo? R: Los dolores de cabeza por tensin ocurren al azar. A menudo son el resultado de estrs temporario, ansiedad, fatiga o ira. Los sntomas Environmental education officerincluyen dolor en las sienes, una sensacin como de tener una banda alrededor de la cabeza (un dolor que "presiona"). Los sntomas pueden incluir una sensacin de Lewistonempuje, de presin y Engineer, materialscontraccin de los msculos de la cabeza y el cuello. El dolor comienza en la frente, sienes o en la parte posterior de la cabeza y el cuello. El tratamiento para los dolores de cabeza por tensin puede incluir medicamentos de Northportventa libre. Tambin puede incluir tcnicas de Peruautoayuda con entrenamientos para la relajacin y biorretroalimentacin. CEFALEA EN RACIMOS P: Qu es una cefalea en racimos? Qu la ocasiona? Cmo puedo tratarla? R: La cefalea en  racimos toma su nombre debido a que los ataques vienen en grupos. El dolor aparece con poco o ningn aviso. Normalmente ocurre de un lado de la cabeza. Muchas veces el dolor viene acompaado de un lagrimeo u ojo rojo y goteo de la nariz del mismo lado que Chief Technology Officer. Se cree que la causa es una reaccin en las sustancias qumicas del cerebro. Se describe como el caso  ms grave e intenso de cualquier tipo de dolor de cabeza. El tratamiento incluye medicamentos bajo receta y oxgeno. CEFALEA SINUSAL P: Qu es una cefalea sinusal? Qu la ocasiona? Cmo puedo tratarla? R: Cuando se inflama una cavidad en los huesos de la cara y el crneo (sinus) ocasiona un dolor localizado. Esta enfermedad generalmente es el resultado de una reaccin alrgica, un tumor o una infeccin. Si el dolor de cabeza est ocasionado por un bloqueo del sinus, como una infeccin, probablemente tendr [place]. Una imagen de rayos X confirmar el bloqueo del sinus. El tratamiento indicado por el mdico podr incluir antibiticos para la infeccin, y tambin antihistamnicos o descongestivos.  DOLOR DE CABEZA POR EFECTO "REBOTE" P: Qu es un dolor de cabeza por efecto "rebote"? Qu lo ocasiona? Cmo puedo tratarlo? R: Si se toman medicamentos para el dolor de cabeza muy a menudo puede llevar a la enfermedad conocida como "dolor de cabeza por rebote". Un patrn de abuso de medicamentos para el dolor de cabeza supone tomarlos ms de [employer] por semana o en cantidades excesivas. Esto significa ms que lo que indica el envase o el mdico. Con los dolores de cabeza por rebote, los medicamentos no slo dejan de Engineer, materials sino que adems comienzan a Data processing manager dolores de [place]. Los mdicos tratan los dolores de cabeza por rebote mediante la disminucin del medicamento del que se ha abusado. A veces el medico podr sustituir gradualmente por un tipo diferente de tratamiento o medicacin. Dejar de consumirlo podra ser difcil. El abuso regular de un medicamento aumenta el potencial que se produzcan efectos secundarios graves. Consulte con un mdico si utiliza regularmente medicamentos para Chief Technology Officer de cabeza ms de [address] por semana o ms de lo que indica el envase. PREGUNTAS Y RESPUESTAS ADICIONALES P: Qu es la biorretroalimentacin? R: La biorretroalimentacin es un tratamiento de [place].  La biorretroalimentacin utiliza un equipamiento especial para controlar los movimientos involuntarios del cuerpo y las respuestas fsicas. La biorretroalimentacin controla:  Respiracin.  Pulso.  Latidos cardacos.  Temperatura.  Tensin muscular.  Actividad cerebrales. La biorretroalimentacin le ayudar a mejorar y Production manager sus ejercicios de Microbiologist. Aprender a Chief Operating Officer las respuestas fsicas relacionadas con el estrs. Una vez que se dominan las tcnicas no necesitar ms el equipamiento. P: Son hereditarios los dolores de [place]? R: Segn algunas estimaciones, aproximadamente 28 millones de estadounidenses sufren migraa. Cuatro de cada cinco (80%) informan una historia familiar de migraa. Los investigadores no pueden asegurar si se trata de un problema gentico o Patent examiner. A pesar de esto, un nio tiene 50% de probabilidades de sufrir migraa si uno de sus padres la sufre. El nio tiene un 75% de probabilidades si ambos padres la sufren.  P. Puede un nio tener migraa? R: En el momento de ingresar a la escuela secundaria, la mayora de los jvenes han experimentado algn tipo de cefalea. Algunos abordajes o medicamentos seguros y Sports administrator las cefaleas o detenerlas luego de que han comenzado.  Olin Hauser tipo de especialista debe ver para diagnosticar y tratar una cefalea? R: Comience  con su mdico de cabecera. Huel Cote de su experiencia y abordaje de las cefaleas. Comente los mtodos de clasificacin, diagnstico y [place]. El profesional decidir si lo derivar a Music therapist, segn los sntomas u otras enfermedades. El hecho de sufrir diabetes, Environmental consultant, Catering manager, puede requerir un abordaje ms complejo. La National Headache Foundation (Fundacin Nacional para las [place]) Chief Technology Officer, a pedido, Physiological scientist lista de los mdicos que son miembros de [place]. Document Released: 11/04/2008 Document Revised: 02/14/2012 [facility] Patient  Information 2015 [place], [place]. This information is not intended to replace advice given to you by your health care provider. Make sure you discuss any questions you have with your health care provider.

## 2014-05-30 NOTE — Progress Notes (Addendum)
Subjective:    Patient ID: Earl Meyer, male    DOB: 12/16/1975, 38 y.o.   MRN: [id number]   This chart was scribed for [name], MD by Chestine Spore, ED Scribe. The patient was seen in room 10 at 8:49 AM.   Chief Complaint  Patient presents with  . Sore Throat    x3 days  . Headache    x2 days, bad headache    HPI Earl Meyer is a 38 y.o. male who presents today complaining of a dry, sore throat onset 3 days ago. Pt was seen 2 days ago by Dr. Dareen Piano with a 2 days h/o sore throat, fever, and muscle aches. He states that he was feeling hot and cold on his last visit with Dr. Dareen Piano. He was diagnosed with suspected strep pharyngitis. He was started on Z-Pack.  He states that he has associated symptoms of a HA onset 2 days ago. He states that the top of his head is where the HA is located.   He states that on monday night he had a max fever of 100. He states that the Z-pack is not working. He states that it is sore to open his mouth.  He states that he is taking it as prescribed. He has not taken his prescribed medications today yet. He states that he has taken 2 pills every 4 hours of Tylenol 3. He has used IBU as well. He states that he is not able to sleep because of his HA.  He states that he has not had a fever. He states that now he has associated symptoms of nausea and stomach pain.   He states that now his daughter is sick with the same symptoms. He states that he does not think that he has had a tick bite. He denies a fever, cough and any other associated symptoms. He has an allergy to Penicillin, he states that he does not know his reaction to Penicillin.     There are no active problems to display for this patient.  Past Medical History  Diagnosis Date  . Allergy    History reviewed. No pertinent past surgical history. Allergies  Allergen Reactions  . Penicillins   . Tessalon [Benzonatate] Dermatitis    Abdomen numb and severe dry  mouth   Prior to Admission medications   Medication Sig Start Date End Date Taking? Authorizing Jia Mohamed  acetaminophen-codeine (TYLENOL #3) 300-30 MG per tablet Take 1-2 tablets by mouth every 4 (four) hours as needed. 05/28/14  Yes Phillips Odor, MD  azithromycin (ZITHROMAX) 250 MG tablet Take 2 tabs PO x 1 dose, then 1 tab PO QD x 4 days 05/28/14  Yes Phillips Odor, MD  Diphenhyd-Hydrocort-Nystatin (FIRST-DUKES MOUTHWASH) SUSP Use as directed 5 mLs in the mouth or throat every 2 (two) hours as needed (sore throat). 11/10/13   Sherren Mocha, MD  HYDROcodone-homatropine [facility]) 5-1.5 MG/5ML syrup Take 5 mLs by mouth every 8 (eight) hours as needed for cough. 02/17/14   Elvina Sidle, MD  ipratropium (ATROVENT) 0.03 % nasal spray Place 2 sprays into the nose 4 (four) times daily. 11/10/13   Sherren Mocha, MD  meloxicam (MOBIC) 7.5 MG tablet 1-2 tablets daily for knee pain 12/05/13   Collene Gobble, MD    Review of Systems  Constitutional: Negative for fever.  HENT: Positive for sore throat.   Respiratory: Negative for cough.   Gastrointestinal: Positive for nausea and abdominal pain.  Neurological: Positive for headaches.  Objective:   Physical Exam  Vitals reviewed. Constitutional: He is oriented to person, place, and time. He appears well-developed and well-nourished.  HENT:  Head: Normocephalic and atraumatic.  Right Ear: Tympanic membrane, external ear and ear canal normal.  Left Ear: Tympanic membrane, external ear and ear canal normal.  Nose: No rhinorrhea.  Mouth/Throat: Oropharynx is clear and moist and mucous membranes are normal. No oropharyngeal exudate, posterior oropharyngeal erythema or tonsillar abscesses.  Slight erythema to the post-anterior.  Eyes: Conjunctivae are normal. Pupils are equal, round, and reactive to light.  Neck: Neck supple.  Cardiovascular: Normal rate, regular rhythm, normal heart sounds and intact distal pulses.   No murmur  heard. Pulmonary/Chest: Effort normal and breath sounds normal. He has no wheezes. He has no rhonchi. He has no rales.  Abdominal: Soft. He exhibits no distension. There is no tenderness.  Lymphadenopathy:    He has no cervical adenopathy.  Neurological: He is alert and oriented to person, place, and time.  Non-focal. Normal upper extremities and lower extremities strength. No weakness.   Skin: Skin is warm and dry. No rash noted.  Psychiatric: He has a normal mood and affect. His behavior is normal.    Filed Vitals:   05/30/14 0821  BP: 112/68  Pulse: 69  Temp: 97.7 F (36.5 C)  Resp: 16  Height: 5\' 3"  (1.6 m)  Weight: 147 lb 12.8 oz (67.042 kg)  SpO2: 98%       Assessment & Plan:   Earl Meyer is a 38 y.o. male Sore throat - Plan: CBC with Differential, HYDROcodone-acetaminophen (NORCO/VICODIN) 5-325 MG per tablet, DISCONTINUED: HYDROcodone-acetaminophen (NORCO/VICODIN) 5-325 MG per tablet  Headache(784.0) - Plan: CBC with Differential, HYDROcodone-acetaminophen (NORCO/VICODIN) 5-325 MG per tablet, DISCONTINUED: HYDROcodone-acetaminophen (NORCO/VICODIN) 5-325 MG per tablet  Nausea alone - Plan: CBC with Differential, ondansetron (ZOFRAN ODT) 4 MG disintegrating tablet  Viral illness - Plan: CBC with Differential  Possible viral illness with dtr with similar sx's.  On Zpak for possible strep pharyngitis by clinical diagnosis 2 days ago. With 4 days of sx's.  Still possible viral illness. No tick exposure, no further fever. Continue zpak., change tylenol 3 to lortab (reprinted to change dosing).  zofran if needed for nausea. Check CBC. Recheck in next 2 days if not improving, sooner if worse - ER/RTC precautions discussed. Spanish spoken, understanding expressed.    Meds ordered this encounter  Medications  . DISCONTD: HYDROcodone-acetaminophen (NORCO/VICODIN) 5-325 MG per tablet    Sig: Take 1 tablet by mouth every 6 (six) hours as needed for moderate pain.     Dispense:  15 tablet    Refill:  0  . ondansetron (ZOFRAN ODT) 4 MG disintegrating tablet    Sig: Take 1 tablet (4 mg total) by mouth every 8 (eight) hours as needed for nausea or vomiting.    Dispense:  10 tablet    Refill:  0  . HYDROcodone-acetaminophen (NORCO/VICODIN) 5-325 MG per tablet    Sig: Take 1-2 tablets by mouth every 6 (six) hours as needed for moderate pain.    Dispense:  15 tablet    Refill:  0   Patient Instructions  Cambia medicina por dolor.  Continua antibiotica, pero es posible tiene un virus - similar a su hija.  Resulta con sangre con telfono en proximo 5-6 horas. por nausea - nueva medicina si necesario. bebe liquidos.  si no esta mejor en 2-3 dias, o peorse antes - regrese aqui o cuarto de Associate Professor.   Dolor de  cabeza, preguntas frecuentes y sus respuestas (Headaches, Frequently Asked Questions) CEFALEAS MIGRAOSAS P: Qu es la migraa? Qu la ocasiona? Cmo puedo tratarla? R: En general, la migraa comienza como un dolor apagado. Luego progresa hacia un dolor, constante, punzante y como un latido. Sentir Scientist, physiological las sienes. Podr sentir Radiographer, therapeutic parte anterior o posterior de la cabeza, o en uno o ambos lados. El dolor suele estar acompaado de una combinacin de:  Nuseas.  Vmitos.  Sensibilidad a la luz y los ruidos. Algunas personas (un 15%) experimentan un aura (ver abajo) antes de un ataque. La causa de la migraa se debe a reacciones qumicas del cerebro. El tratamiento para la migraa puede incluir medicamentos de [place]. Tambin puede incluir tcnicas de [place]. Estas incluyen entrenamientos para la relajacin y biorretroalimentacin.  P: Qu es un aura? R: Alrededor del 15% de las personas con migraa tiene un "aura". Es una seal de sntomas neurolgicos que ocurren antes de un dolor de cabeza por migraas. Podr ver lneas onduladas o irregulares, puntos o luces parpadeantes. Podr experimentar visin de tnel o puntos ciegos  en uno o ambos ojos. El aura puede incluir alucinaciones visuales o auditivas (algo que se imagina). Puede incluir trastornos en el olfato (como olores extraos), el tacto o el gusto. Entre otros sntomas se incluyen:  Adormecimiento.  Sensacin de hormigueo.  Dificultad para recordar o Occupational hygienist. Estos episodios neurolgicos pueden durar hasta 60 minutos. Los sntomas desaparecern a medida que el dolor de cabeza comience. P:Qu es un disparador? R: Ciertos factores fsicos o Sports administrator a "disparar" una migraa. Estos son:  Alimentos.  Cambios hormonales.  Clima.  Estrs. Es importante recordar que los disparadores son diferentes entre si. Para ayudar a prevenir ataques de migraas, necesitar descubrir cules son los Psychologist, prison and probation services. Lleve un diario sobre sus dolores de [place]. Este es un buen modo para descubrir los disparadores. El Sales executive en el momento de hablar con el profesional acerca de su enfermedad. P: El clima afecta en las migraas? R: La luz solar, el calor, la humedad y lo cambios drsticos en la presin Software engineer a, o "disparar" un ataque de migraa en [employer]. Pero estudios han demostrado que el clima no acta como disparador para todas las personas con [place]. P: Cul es la relacin entre la migraa y la hormonas? R: Las hormonas inician y regulan muchas de las funciones corporales. Las hormonas [employer] balance en el cuerpo dentro de los constantes cambios de [place]. Algunas veces, el nivel de hormonas en el cuerpo se desbalancea. Por ejemplo, durante la menstruacin, el embarazo o la [place]. Pueden ser la causa de un ataque de migraa. De hecho, alrededor de tres cuartos de las mujeres con migraa informan que sus ataques estn relacionados con el ciclo menstrual.  P: Aumenta el riesgo de sufrir un choque cardaco en las personas que padecen migraa? R: La probabilidad de que un  ataque de migraa ocasione un ataque cardaco es muy remota. Esto no quiere [employer] persona que sufre de migraa no pueda tener un ataque cardaco asociado con ella. En las personas menores de 40 aos, el factor ms comn para un ataque es la [place]. Pero durante la vida de una persona, la ocurrencia de un dolor de cabeza por migraa est asociada con una reduccin en el riesgo de morir por un ataque cerebrovascular.  P: Cules son los medicamentos para la migraa? R: La medicacin  precisa se [place] ([place]) Islandsutiliza para tratar el dolor de cabeza una vez que ha comenzado. Son ejemplos, medicamentos de [place]venta libre, desinflamatorios sin esteroides, ergotamnicos y triptanos.  P: Qu son los triptanos? R: Lo triptanos son Yaakov Guthrieuna nueva clase de medicamentos abortivos. Son especficos para tratar este problema. Los triptanos son vasoconstrictores. Moderan algunas reacciones qumicas del cerebro. Los triptanos trabajan como receptores del cerebro. Ayudan a Warehouse managerrestaurar el balance de un neurotransmisor denominado serotonina. Se cree que las fluctuaciones en los Mundenniveles de serotonina son la causa principal de la migraa.  P: Son Colgate-Palmoliveefectivos los medicamentos de venta libre para la migraa? R: Los medicamentos de H. J. Heinzventa libre pueden ser efectivos para Paramedicaliviar dolores leves a moderados y los sntomas asociados a la [place]akemigraa. Pero deber consultar a un mdico antes de Charity fundraisercomenzar cualquier tratamiento para la migraa.  P: Cules son los medicamentos de prevencin de la migraa? R: Se suele denominar tratamiento "profilctico" a los medicamentos para la prevencin de la migraa. Se utilizan para reducir Print production plannerla frecuencia, gravedad y duracin de los ataques de Dellrosemigraa. Son ejemplos de medicamentos de prevencin: antiepilpticos, antidepresivos, bloqueadores beta, bloqueadores de los canales de calcio y medicamentos antiinflamatorios sin esteroides. P:  Por qu se utilizan anticonvulsivantes para tratar la migraa? R: Durante los ltimos  aos, ha habido un creciente inters en las drogas antiepilpticas para la prevencin de la migraa. A menudo se los conoce como "anticonvulsivantes". La epilepsia y la migraa suceden por reacciones similares en el cerebro.  P:  Por qu se utilizan antidepresivos para tratar la migraa? R: Los antidepresivos tpicamente se Lao People's Democratic Republicutilizan para tratar a las personas con depresin. Pueden reducir la frecuencia de la migraa a travs de la regulacin de los niveles qumicos, como la serotonina, en el cerebro.  P:  Por qu se utilizan terapias alternativas para tratar la migraa? R: El trmino "terapias alternativas" suelen utilizarse para describir los tratamientos que se considera que estn por fuera de Occupational hygienistalcance la medicina occidental convencional. Son ejemplos de las terapias alternativas: la acupuntura, la acupresin y el yoga. Otra terapia alternativa comn es la terapia herbal. Se cree que algunas hierbas ayudan a Corning Incorporatedaliviar los dolores de Turkmenistancabeza. Siempre consulte con Bed Bath & Beyondel profesional acerca de las terapias alternativas antes de Daltonutilizarlas. Algunos productos herbales contienen arsnico y Phillipsburgotras toxinas. DOLORES DE CABEZA POR TENSIN P: Qu es un dolor de cabeza por tensin? Qu lo ocasiona? Cmo puedo tratarlo? R: Los dolores de cabeza por tensin ocurren al azar. A menudo son el resultado de estrs temporario, ansiedad, fatiga o ira. Los sntomas Environmental education officerincluyen dolor en las sienes, una sensacin como de tener una banda alrededor de la cabeza (un dolor que "presiona"). Los sntomas pueden incluir una sensacin de Pasadena Parkempuje, de presin y Engineer, materialscontraccin de los msculos de la cabeza y el cuello. El dolor comienza en la frente, sienes o en la parte posterior de la cabeza y el cuello. El tratamiento para los dolores de cabeza por tensin puede incluir medicamentos de [place] Lakesventa libre. Tambin puede incluir tcnicas de Peruautoayuda con entrenamientos para la relajacin y biorretroalimentacin. CEFALEA EN RACIMOS P: Qu es una cefalea en  racimos? Qu la ocasiona? Cmo puedo tratarla? R: La cefalea en racimos toma su nombre debido a que los ataques vienen en grupos. El dolor aparece con poco o ningn aviso. Normalmente ocurre de un lado de la cabeza. Muchas veces el dolor viene acompaado de un lagrimeo u ojo rojo y goteo de la nariz del mismo lado que Chief Technology Officerel dolor. Se cree que la causa es [place]reuna  reaccin en las sustancias qumicas del cerebro. Se describe como el caso ms grave e intenso de cualquier tipo de dolor de cabeza. El tratamiento incluye medicamentos bajo receta y oxgeno. CEFALEA SINUSAL P: Qu es una cefalea sinusal? Qu la ocasiona? Cmo puedo tratarla? R: Cuando se inflama una cavidad en los huesos de la cara y el crneo (sinus) ocasiona un dolor localizado. Esta enfermedad generalmente es el resultado de una reaccin alrgica, un tumor o una infeccin. Si el dolor de cabeza est ocasionado por un bloqueo del sinus, como una infeccin, probablemente tendr [place]. Una imagen de rayos X confirmar el bloqueo del sinus. El tratamiento indicado por el mdico podr incluir antibiticos para la infeccin, y tambin antihistamnicos o descongestivos.  DOLOR DE CABEZA POR EFECTO "REBOTE" P: Qu es un dolor de cabeza por efecto "rebote"? Qu lo ocasiona? Cmo puedo tratarlo? R: Si se toman medicamentos para el dolor de cabeza muy a menudo puede llevar a la enfermedad conocida como "dolor de cabeza por rebote". Un patrn de abuso de medicamentos para el dolor de cabeza supone tomarlos ms de [employer] por semana o en cantidades excesivas. Esto significa ms que lo que indica el envase o el mdico. Con los dolores de cabeza por rebote, los medicamentos no slo dejan de Engineer, materials sino que adems comienzan a Data processing manager dolores de [place]. Los mdicos tratan los dolores de cabeza por rebote mediante la disminucin del medicamento del que se ha abusado. A veces el medico podr sustituir gradualmente por un tipo diferente de tratamiento  o medicacin. Dejar de consumirlo podra ser difcil. El abuso regular de un medicamento aumenta el potencial que se produzcan efectos secundarios graves. Consulte con un mdico si utiliza regularmente medicamentos para Chief Technology Officer de cabeza ms de [address] por semana o ms de lo que indica el envase. PREGUNTAS Y RESPUESTAS ADICIONALES P: Qu es la biorretroalimentacin? R: La biorretroalimentacin es un tratamiento de [place]. La biorretroalimentacin utiliza un equipamiento especial para controlar los movimientos involuntarios del cuerpo y las respuestas fsicas. La biorretroalimentacin controla:  Respiracin.  Pulso.  Latidos cardacos.  Temperatura.  Tensin muscular.  Actividad cerebrales. La biorretroalimentacin le ayudar a mejorar y Production manager sus ejercicios de Microbiologist. Aprender a Chief Operating Officer las respuestas fsicas relacionadas con el estrs. Una vez que se dominan las tcnicas no necesitar ms el equipamiento. P: Son hereditarios los dolores de [place]? R: Segn algunas estimaciones, aproximadamente 28 millones de estadounidenses sufren migraa. Cuatro de cada cinco (80%) informan una historia familiar de migraa. Los investigadores no pueden asegurar si se trata de un problema gentico o Patent examiner. A pesar de esto, un nio tiene 50% de probabilidades de sufrir migraa si uno de sus padres la sufre. El nio tiene un 75% de probabilidades si ambos padres la sufren.  P. Puede un nio tener migraa? R: En el momento de ingresar a la escuela secundaria, la mayora de los jvenes han experimentado algn tipo de cefalea. Algunos abordajes o medicamentos seguros y Sports administrator las cefaleas o detenerlas luego de que han comenzado.  Olin Hauser tipo de especialista debe ver para diagnosticar y tratar una cefalea? R: Comience con su mdico de cabecera. Huel Cote de su experiencia y abordaje de las cefaleas. Comente los mtodos de clasificacin, diagnstico  y [place]. El profesional decidir si lo derivar a Music therapist, segn los sntomas u otras enfermedades. El hecho de sufrir diabetes, Environmental consultant, Catering manager, puede requerir un abordaje ms complejo. La National Headache Foundation [facility] para  las SYSCOCefaleas) proporcionar, a pedido, Agricultural engineeruna lista de los mdicos que son miembros de [place] Heightssu estado. Document Released: 11/04/2008 Document Revised: 02/14/2012 [facility] Patient Information 2015 [place] LondonExitCare, MarylandLLC. This information is not intended to replace advice given to you by your health care Wilbern Pennypacker. Make sure you discuss any questions you have with your health care Brigitt Mcclish.     12:26 PM: Addendum - CBC results noted - WBC 13.3, 94%granulocytes. Normal platelets. Still possible strep pharyngitis with possible resistance to azithro - will stop azithro at this point, start clindamycin - 300mg  TID for 10 days and recheck in 2 days with me if not much improved. Sooner if worse.  Discussed in spanish on phone (interpreter used) and understanding expressed.

## 2014-05-31 ENCOUNTER — Encounter (HOSPITAL_COMMUNITY): Payer: Self-pay | Admitting: Emergency Medicine

## 2014-05-31 ENCOUNTER — Emergency Department (HOSPITAL_COMMUNITY)
Admission: EM | Admit: 2014-05-31 | Discharge: 2014-05-31 | Discharge status: Against Medical Advice | Payer: Self-pay | Attending: Emergency Medicine | Admitting: Emergency Medicine

## 2014-05-31 DIAGNOSIS — R51 Headache: Secondary | ICD-10-CM | POA: Insufficient documentation

## 2014-05-31 NOTE — ED Notes (Signed)
Patient at the desk stating he is going to go home and take his medicine.  Instructed to come back if gets worse or if things change.

## 2014-05-31 NOTE — ED Notes (Signed)
Pt. reports mild headache after taking antibiotic prescribed [facility] , denies fever / no nausea.

## 2014-09-25 ENCOUNTER — Telehealth: Payer: Self-pay

## 2014-09-25 ENCOUNTER — Ambulatory Visit (INDEPENDENT_AMBULATORY_CARE_PROVIDER_SITE_OTHER): Payer: Self-pay | Admitting: Emergency Medicine

## 2014-09-25 VITALS — BP 102/68 | HR 57 | Temp 97.8°F | Resp 16 | Ht 63.25 in | Wt 144.0 lb

## 2014-09-25 DIAGNOSIS — J018 Other acute sinusitis: Secondary | ICD-10-CM

## 2014-09-25 MED ORDER — AZITHROMYCIN 250 MG PO TABS
ORAL_TABLET | ORAL | Status: DC
Start: 2014-09-25 — End: 2014-09-25

## 2014-09-25 MED ORDER — HYDROCORTISONE ACETATE 25 MG RE SUPP
25.0000 mg | Freq: Two times a day (BID) | RECTAL | Status: DC
Start: 2014-09-25 — End: 2014-09-25

## 2014-09-25 MED ORDER — HYDROCORTISONE ACETATE 25 MG RE SUPP: 25.0000 mg | Freq: Two times a day (BID) | RECTAL | Status: DC

## 2014-09-25 MED ORDER — PSEUDOEPHEDRINE-GUAIFENESIN ER 60-600 MG PO TB12: 1.0000 | ORAL_TABLET | Freq: Two times a day (BID) | ORAL | Status: DC

## 2014-09-25 MED ORDER — AZITHROMYCIN 250 MG PO TABS: ORAL_TABLET | ORAL | Status: DC

## 2014-09-25 NOTE — Telephone Encounter (Signed)
[facility] [address] Pharmacy says they have not received medications.  azithromycin (ZITHROMAX) 250 MG tablet hydrocortisone (ANUSOL-HC) 25 MG suppository pseudoephedrine-guaifenesin (MUCINEX D) 60-600 MG per tablet

## 2014-09-25 NOTE — Telephone Encounter (Signed)
Resent rx to pharmacy.

## 2014-09-25 NOTE — Patient Instructions (Signed)
Hemorroides  (Hemorrhoids) Las hemorroides son venas inflamadas alrededor del recto o del ano. Hay dos tipos de hemorroides:   Hemorroides internas. Se forman en las venas del interior del recto. Pueden abultarse hacia el exterior e irritarse y doler.  Hemorroides externas. Se producen en las venas externas al ano y pueden sentirse como un bulto o zona hinchada dura y dolorosa cerca del ano. CAUSAS   Embarazo.   Obesidad.   Constipacin o diarrea.   Dificultad para mover el intestino.   Permanecer sentado durante largos perodos en el inodoro.  Levantar objetos pesados u otras actividades que impliquen esfuerzo.  Sexo anal. SNTOMAS   Dolor.   Picazn o irritacin anal.   Sangrado rectal.   Prdida fecal.   Inflamacin anal.   Uno o ms bultos en la zona anal.  DIAGNSTICO  El mdico puede diagnosticar las hemorroides mediante un examen visual. Otros estudios o anlisis que se pueden realizar son:   Examen de la zona rectal con una mano enguantada (examen digital rectal).   Examen del canal anal utilizando un pequeo tubo (endoscopio).   Anlisis de sangre si ha perdido una cantidad significativa de sangre.  Un estudio para observar el interior del colon (sigmoideoscopa o colonoscopa). TRATAMIENTO  La mayora de las hemorroides pueden tratarse en casa. Sin embargo, si los sntomas no mejoran o tiene sangrado rectal, el mdico puede realizar un procedimiento para disminuir las hemorroides o extirparlas completamente. Los tratamientos posibles son:   Colocacin de una banda de goma en la base de la hemorroide para cortar la circulacin (ligadura con banda elstica).   Inyeccin de una sustancia qumica para achicar las hemorroides (escleroterapia).   Utilizacin de un instrumento para quemar las hemorroides (terapia con luz infrarroja).   Extirpacin quirrgica de la hemorroides (hemorroidectoma).   Colocacin de grapas en la hemorroides para  bloquear el flujo de sangre a los tejidos (engrapado de hemorroides).  INSTRUCCIONES PARA EL CUIDADO EN EL HOGAR   Consuma alimentos con fibra, como cereales integrales, legumbres, frutos secos, frutas y verduras. Pregntele a su mdico acerca de tomar productos con fibra aadida en ellos (suplementos defibra).  Aumente la ingestin de lquidos. Beba gran cantidad de lquido para mantener la orina de tono claro o color amarillo plido.   Haga ejercicios regularmente.   Vaya al bao cuando sienta la necesidad de mover el intestino. No espere.   Evite hacer fuerza al mover el intestino.   Mantenga la zona anal limpia y seca. Use papel higinico hmedo o toallitas humedecidas despus de mover el intestino.   Puede usar o aplicar segn las indicaciones algunas cremas especiales y supositorios.   Tome slo medicamentos de venta libre o recetados, segn las indicaciones del mdico.   Tome baos de asiento tibios durante 15-20 minutos, 3-4 veces por da para calmar el dolor y las molestias.   Coloque una bolsa de hielo sobre las hemorroides si le duelen o se hinchan. Usar compresas de hielo entre los baos de asiento puede ser efectivo.   Ponga el hielo en una bolsa plstica.   Colquese una toalla entre la piel y la bolsa de hielo.   Deje el hielo durante 15 - 20 minutos y aplquelo 3 - 4 veces por da.   No utilice una almohada en forma de aro ni se siente en el inodoro durante perodos prolongados. Esto aumenta la afluencia de sangre y el dolor.  SOLICITE ATENCIN MDICA SI:   Aumenta el dolor y la hinchazn y no   puede controlarlo con la medicacin o con un tratamiento.  Tiene un sangrado que no puede controlar.  No puede mover el intestino.  Siente dolor o tiene inflamacin fuera de la zona de las hemorroides. ASEGRESE DE QUE:   Comprende estas instrucciones.  Controlar su enfermedad.  Recibir ayuda de inmediato si no mejora o si empeora. Document  Released: 11/22/2005 Document Revised: 07/25/2013 ExitCare Patient Information 2015 ExitCare, LLC. This information is not intended to replace advice given to you by your health care provider. Make sure you discuss any questions you have with your health care provider.  

## 2014-09-25 NOTE — Progress Notes (Signed)
[facility] [address], [place] [address] [phone]  Date:  09/25/2014   Name:  Earl LooseSalvador Meyer   DOB:  08/28/1976   MRN:  [id number]  PCP:  No PCP Per Patient    Chief Complaint: Sore Throat and Headache   History of Present Illness:  [name] is a 38 y.o. very pleasant male patient who presents with the following:  Ill several days with watery nasal drainage and post nasal drip.  Sore throat. No pain in ears. Has a cough productive scant sputum  No wheezing or shortness of breath. No fever or chills. No improvement with over the counter medications or other home remedies. Denies other complaint or health concern today.   There are no active problems to display for this patient.   Past Medical History  Diagnosis Date  . Allergy     History reviewed. No pertinent past surgical history.  History  Substance Use Topics  . Smoking status: Never Smoker   . Smokeless tobacco: Not on file  . Alcohol Use: No    Family History  Problem Relation Age of Onset  . Cancer Father     Allergies  Allergen Reactions  . Penicillins     childhood  . Tessalon [Benzonatate] Dermatitis    Abdomen numb and severe dry mouth    Medication list has been reviewed and updated.  No current outpatient prescriptions on file prior to visit.   No current facility-administered medications on file prior to visit.    Review of Systems:  As per HPI, otherwise negative.    Physical Examination: Filed Vitals:   09/25/14 0952  BP: 102/68  Pulse: 57  Temp: 97.8 F (36.6 C)  Resp: 16   Filed Vitals:   09/25/14 0952  Height: 5' 3.25" (1.607 m)  Weight: 144 lb (65.318 kg)   Body mass index is 25.29 kg/(m^2). Ideal Body Weight: Weight in (lb) to have BMI = 25: 142  GEN: WDWN, NAD, Non-toxic, A & O x 3 HEENT: Atraumatic, Normocephalic. Neck supple. No masses, No LAD. Ears and Nose: No external deformity. CV: RRR, No  M/G/R. No JVD. No thrill. No extra heart sounds. PULM: CTA B, no wheezes, crackles, rhonchi. No retractions. No resp. distress. No accessory muscle use. ABD: S, NT, ND, +BS. No rebound. No HSM. EXTR: No c/c/e NEURO Normal gait.  PSYCH: Normally interactive. Conversant. Not depressed or anxious appearing.  Calm demeanor.    Assessment and Plan: Sinusitis augmentin mucinex d  Signed,  Phillips OdorJeffery Dail Lerew, MD

## 2014-12-22 ENCOUNTER — Ambulatory Visit (INDEPENDENT_AMBULATORY_CARE_PROVIDER_SITE_OTHER): Payer: 59 | Admitting: Family Medicine

## 2014-12-22 VITALS — BP 120/64 | HR 78 | Temp 98.1°F | Resp 98 | Ht 63.0 in | Wt 149.8 lb

## 2014-12-22 DIAGNOSIS — J0100 Acute maxillary sinusitis, unspecified: Secondary | ICD-10-CM

## 2014-12-22 MED ORDER — AZITHROMYCIN 250 MG PO TABS: ORAL_TABLET | ORAL | Status: DC

## 2014-12-22 NOTE — Patient Instructions (Signed)
Sinusitis (Sinusitis) La sinusitis es el enrojecimiento, el dolor y la inflamacin de los senos paranasales. Los senos paranasales son bolsas de aire que se encuentran dentro de los huesos de la cara (por debajo de los ojos, en la mitad de la frente o por encima de los ojos). En los senos paranasales sanos, el moco puede drenar y el aire circula a travs de ellos en su camino hacia la nariz. Sin embargo, cuando se inflaman, el moco y el aire quedan atrapados. Esto hace que se desarrollen bacterias y otros grmenes que causan infeccin. La sinusitis puede desarrollarse rpidamente y durar solo un tiempo corto (aguda) o continuar por un perodo largo (crnica). La sinusitis que dura ms de 12 semanas se considera crnica.  CAUSAS  Las causas de la sinusitis son:  Cualquier alergia que tenga.  Las anomalas estructurales, como el desplazamiento del cartlago que separa las fosas nasales (desvo del tabique), que pueden disminuir el flujo de aire por la nariz y los senos paranasales, y afectar su drenaje.  Las anomalas funcionales, como cuando los pequeos pelos (cilias) que se encuentran en los senos paranasales y que ayudan a eliminar el moco no funcionan correctamente o no estn presentes. SIGNOS Y SNTOMAS  Los sntomas de la sinusitis aguda y crnica son los mismos. Los sntomas principales son el dolor y la presin alrededor de los senos paranasales afectados. Otros sntomas son:  Dolor en los dientes superiores.  Dolor de odos.  Dolor de cabeza.  Mal aliento.  Disminucin del sentido del olfato y del gusto.  Tos, que empeora al acostarse.  Fatiga.  Fiebre.  Drenaje de moco espeso por la nariz, que generalmente es de color verde y puede contener pus (purulento).  Hinchazn y calor en los senos paranasales afectados. DIAGNSTICO  Su mdico le realizar un examen fsico. Durante el examen, el mdico:  Le revisar la nariz para buscar signos de crecimientos anormales en las  fosas nasales (plipos nasales).  Palpar los senos paranasales afectados para buscar signos de infeccin.  Ver el interior de los senos paranasales (endoscopia) a travs de un dispositivo de obtencin de imgenes que tiene una luz conectada (endoscopio). Si el mdico sospecha que usted sufre sinusitis crnica, podr indicar una o ms de las siguientes pruebas:  Pruebas de alergia.  Cultivo de las secreciones nasales. Se extrae una muestra de moco de la nariz, que se enva al laboratorio para detectar bacterias.  Citologa nasal. Se extrae una muestra de moco de la nariz, que el mdico examina para determinar si la sinusitis est relacionada con una alergia. TRATAMIENTO  La mayora de los casos de sinusitis aguda se deben a una infeccin viral y se resuelven espontneamente en un perodo de 10das. En algunos casos, se recetan medicamentos para aliviar los sntomas (analgsicos, descongestivos, aerosoles nasales con corticoides o aerosoles salinos).  Sin embargo, para la sinusitis por infeccin bacteriana, el mdico le recetar antibiticos. Los antibiticos son medicamentos que destruyen las bacterias que causan la infeccin.  Con poca frecuencia, la sinusitis tiene su origen en una infeccin por hongos. En estos casos, el mdico recetar un medicamento antimictico. Para algunos casos de sinusitis crnica, es necesario someterse a una ciruga. Generalmente, se trata de casos en los que la sinusitis se repite ms de 3veces al ao, a pesar de otros tratamientos. INSTRUCCIONES PARA EL CUIDADO EN EL HOGAR   Beber gran cantidad de lquidos. Los lquidos ayudan a disolver el moco, para que drene ms fcilmente de los senos paranasales.    Use un humidificador.  Inhale vapor de 3 a 4 veces al da (por ejemplo, sintese en el bao con la ducha abierta).  Aplquese un pao tibio y hmedo en la cara 3 o 4 veces al da o segn las indicaciones del mdico.  Use un aerosol nasal salino para ayudar  a humedecer y limpiar los senos nasales.  Tome los medicamentos solamente como se lo haya indicado el mdico.  Si le recetaron un antimictico o un antibitico, asegrese de terminarlos, incluso si comienza a sentirse mejor. SOLICITE ATENCIN MDICA DE INMEDIATO SI:  Siente ms dolor o sufre dolores de cabeza intensos.  Tiene nuseas, vmitos o somnolencia.  Observa hinchazn alrededor del rostro.  Tiene problemas de visin.  Presenta rigidez en el cuello.  Tiene dificultad para respirar. ASEGRESE DE QUE:   Comprende estas instrucciones.  Controlar su afeccin.  Recibir ayuda de inmediato si no mejora o si empeora. Document Released: 09/01/2005 Document Revised: 04/08/2014 Exit[employer] Patient Information 2015 [employer]. This information is not intended to replace advice given to you by your health care provider. Make sure you discuss any questions you have with your health care provider.  

## 2014-12-22 NOTE — Progress Notes (Signed)
Subjective:  This chart was scribed for Elvina Sidle MD, by Veverly Fells, at [facility].  This patient was seen in room 9 and the patient's care was started at 1:40 PM.   Patient ID: Earl Meyer, male    DOB: 08-05-1976, 39 y.o.   MRN: [id number]  HPI  HPI Comments: Earl Meyer is a 39 y.o. male who [facility] for a cough, body aches, and eye problem.  Symptoms began yesterday sore throat and with phlegm that makes him cough.  Patient has had some chills and notes that he is tired with achy muscles. Patient is allergic to penicillin.   Patient works as a Education administrator.   - Patient notes that he has eye aches/ burning and redness which may be due to his sinuses.      -patient is spanish speaking.      There are no active problems to display for this patient.  Past Medical History  Diagnosis Date  . Allergy    History reviewed. No pertinent past surgical history. Allergies  Allergen Reactions  . Penicillins     childhood  . Tessalon [Benzonatate] Dermatitis    Abdomen numb and severe dry mouth   Prior to Admission medications   Medication Sig Start Date End Date Taking? Authorizing Provider  azithromycin (ZITHROMAX) 250 MG tablet Take 2 tabs PO x 1 dose, then 1 tab PO QD x 4 days Patient not taking: Reported on 12/22/2014 09/25/14   Carmelina Dane, MD  fexofenadine-pseudoephedrine (ALLEGRA-D) 60-120 MG per tablet Take 1 tablet by mouth 2 (two) times daily.    Historical Provider, MD  hydrocortisone (ANUSOL-HC) 25 MG suppository Place 1 suppository (25 mg total) rectally 2 (two) times daily. Patient not taking: Reported on 12/22/2014 09/25/14   Carmelina Dane, MD  pseudoephedrine-guaifenesin [facility] D) 60-600 MG per tablet Take 1 tablet by mouth every 12 (twelve) hours. Patient not taking: Reported on 12/22/2014 09/25/14 09/25/15  Carmelina Dane, MD   History   Social History    . Marital Status: Married    Spouse Name: N/A    Number of Children: N/A  . Years of Education: N/A   Occupational History  . Not on file.   Social History Main Topics  . Smoking status: Never Smoker   . Smokeless tobacco: Never Used  . Alcohol Use: No  . Drug Use: No  . Sexual Activity: Yes    Birth Control/ Protection: None   Other Topics Concern  . Not on file   Social History Narrative       Review of Systems  Constitutional: Positive for chills and fatigue.  Eyes: Positive for pain.  Respiratory: Positive for cough. Negative for chest tightness and shortness of breath.   Musculoskeletal: Positive for myalgias.       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 He's describing 2 days of facial pain, body aches and muscle tenderness along with sore throat and some congestion at the upper part of his windpipe HEENT: Mild erythematous throat, normal TMs, mild mucopurulent discharge from his nose Chest: Clear Heart: Regular no murmur Skin, warm and dry   Filed Vitals:   12/22/14 1322  BP: 120/64  Pulse: 78  Temp: 98.1 F (36.7 C)  TempSrc: Oral  Resp: 98  Height:  (1.6 m)  Weight: 149 lb 12.8 oz (67.949 kg)  SpO2: 98%       Assessment & Plan:    This chart was scribed in my presence and reviewed by me personally.    ICD-9-CM ICD-10-CM   1. Acute maxillary sinusitis, recurrence not specified 461.0 J01.00 azithromycin (ZITHROMAX) 250 MG tablet     Signed, Elvina SidleKurt Esra Frankowski, MD

## 2015-02-17 ENCOUNTER — Ambulatory Visit (INDEPENDENT_AMBULATORY_CARE_PROVIDER_SITE_OTHER): Payer: 59 | Admitting: Emergency Medicine

## 2015-02-17 VITALS — BP 126/76 | HR 60 | Temp 98.1°F | Resp 18 | Ht 64.0 in | Wt 147.0 lb

## 2015-02-17 DIAGNOSIS — J020 Streptococcal pharyngitis: Secondary | ICD-10-CM | POA: Diagnosis not present

## 2015-02-17 DIAGNOSIS — J0100 Acute maxillary sinusitis, unspecified: Secondary | ICD-10-CM | POA: Diagnosis not present

## 2015-02-17 MED ORDER — AZITHROMYCIN 250 MG PO TABS: ORAL_TABLET | ORAL | Status: DC

## 2015-02-17 NOTE — Progress Notes (Signed)
[facility] [address], [place] [address] [phone]  Date:  02/17/2015   Name:  Earl Meyer   DOB:  01/11/1976   MRN:  [id number]  PCP:  No PCP Per Patient    Chief Complaint: Sore Throat   History of Present Illness:  Earl SawyersSalvador Meyer is a 39 y.o. very pleasant male patient who presents with the following:  Ill for several days with sore throat and increasing hoarseness.  No cough or coryza.   No post nasal drainage No chills but fever. Recurrent strep. No improvement with over the counter medications or other home remedies.  Denies other complaint or health concern today.   There are no active problems to display for this patient.   Past Medical History  Diagnosis Date  . Allergy     History reviewed. No pertinent past surgical history.  History  Substance Use Topics  . Smoking status: Never Smoker   . Smokeless tobacco: Never Used  . Alcohol Use: No    Family History  Problem Relation Age of Onset  . Cancer Father     Allergies  Allergen Reactions  . Penicillins     childhood  . Tessalon [Benzonatate] Dermatitis    Abdomen numb and severe dry mouth    Medication list has been reviewed and updated.  Current Outpatient Prescriptions on File Prior to Visit  Medication Sig Dispense Refill  . azithromycin (ZITHROMAX) 250 MG tablet Take 2 tabs PO x 1 dose, then 1 tab PO QD x 4 days (Patient not taking: Reported on 02/17/2015) 6 tablet 0  . fexofenadine-pseudoephedrine (ALLEGRA-D) 60-120 MG per tablet Take 1 tablet by mouth 2 (two) times daily.    . hydrocortisone (ANUSOL-HC) 25 MG suppository Place 1 suppository (25 mg total) rectally 2 (two) times daily. (Patient not taking: Reported on 12/22/2014) 12 suppository 0  . pseudoephedrine-guaifenesin (MUCINEX D) 60-600 MG per tablet Take 1 tablet by mouth every 12 (twelve) hours. (Patient not taking: Reported on 12/22/2014) 18 tablet 0   No current  facility-administered medications on file prior to visit.    Review of Systems:  As per HPI, otherwise negative.    Physical Examination: Filed Vitals:   02/17/15 1042  BP: 126/76  Pulse: 60  Temp: 98.1 F (36.7 C)  Resp: 18   Filed Vitals:   02/17/15 1042  Height: 5\' 4"  (1.626 m)  Weight: 147 lb (66.679 kg)   Body mass index is 25.22 kg/(m^2). Ideal Body Weight: Weight in (lb) to have BMI = 25: 145.3  GEN: WDWN, NAD, Non-toxic, A & O x 3 HEENT: Atraumatic, Normocephalic. Neck supple. No masses, No LAD. Ears and Nose: No external deformity. CV: RRR, No M/G/R. No JVD. No thrill. No extra heart sounds. PULM: CTA B, no wheezes, crackles, rhonchi. No retractions. No resp. distress. No accessory muscle use. ABD: S, NT, ND, +BS. No rebound. No HSM. EXTR: No c/c/e NEURO Normal gait.  PSYCH: Normally interactive. Conversant. Not depressed or anxious appearing.  Calm demeanor.    Assessment and Plan: Strep Pen vk  Signed,  Phillips OdorJeffery Lovell Nuttall, MD

## 2015-02-17 NOTE — Patient Instructions (Signed)

## 2015-05-23 ENCOUNTER — Ambulatory Visit (INDEPENDENT_AMBULATORY_CARE_PROVIDER_SITE_OTHER): Payer: 59 | Admitting: Physician Assistant

## 2015-05-23 VITALS — BP 100/78 | HR 55 | Temp 97.8°F | Resp 16 | Ht 64.0 in | Wt 143.0 lb

## 2015-05-23 DIAGNOSIS — J069 Acute upper respiratory infection, unspecified: Secondary | ICD-10-CM

## 2015-05-23 DIAGNOSIS — R0981 Nasal congestion: Secondary | ICD-10-CM

## 2015-05-23 DIAGNOSIS — J029 Acute pharyngitis, unspecified: Secondary | ICD-10-CM | POA: Diagnosis not present

## 2015-05-23 DIAGNOSIS — B9789 Other viral agents as the cause of diseases classified elsewhere: Secondary | ICD-10-CM

## 2015-05-23 LAB — POCT RAPID STREP A (OFFICE): Rapid Strep A Screen: NEGATIVE

## 2015-05-23 MED ORDER — NAPROXEN 500 MG PO TBEC: 500.0000 mg | DELAYED_RELEASE_TABLET | Freq: Two times a day (BID) | ORAL | Status: AC

## 2015-05-23 MED ORDER — CETIRIZINE-PSEUDOEPHEDRINE ER 5-120 MG PO TB12: 1.0000 | ORAL_TABLET | ORAL | Status: AC

## 2015-05-23 MED ORDER — CODEINE POLT-CHLORPHEN POLT ER 14.7-2.8 MG/5ML PO SUER: 10.0000 mL | Freq: Every day | ORAL | Status: DC

## 2015-05-23 NOTE — Progress Notes (Signed)
05/23/2015 at 9:45 AM  Earl Meyer / DOB: Oct 08, 1976 / MRN: [id number]  The patient  does not have a problem list on file.  SUBJECTIVE  Chief complaint: Sore Throat and Nasal Congestion  Earl Meyer is a 39 y.o. male complaining of sinus and nasal congestion and sore throat that started 3 days ago.  Associated symptoms include dry cough today, and he denies fever, difficulty breathing, headache and jaw pain.The patient symptoms show no change. Treatments tried thus far include benadyrl with poor relief. He reports sick contacts.   He  has a past medical history of Allergy.    Medications reviewed and updated by myself where necessary, and exist elsewhere in the encounter.   Earl Meyer is allergic to penicillins and tessalon. He  reports that he has never smoked. He has never used smokeless tobacco. He reports that he does not drink alcohol or use illicit drugs. He  reports that he currently engages in sexual activity. He reports using the following method of birth control/protection: None. The patient  has no past surgical history on file.  His family history includes Cancer in his father.  Review of Systems  Constitutional: Negative for fever.  Respiratory: Negative for hemoptysis and shortness of breath.   Cardiovascular: Negative.  Negative for chest pain and palpitations.  Gastrointestinal: Negative.  Negative for nausea.  Genitourinary: Negative.   Skin: Negative for rash.  Neurological: Negative for dizziness and headaches.    OBJECTIVE  His  height is 5\' 4"  (1.626 m) and weight is 143 lb (64.864 kg). His oral temperature is 97.8 F (36.6 C). His blood pressure is 100/78 and his pulse is 55. His respiration is 16 and oxygen saturation is 98%.  The patient's body mass index is 24.53 kg/(m^2).  Physical Exam  Constitutional: He is oriented to person, place, and time. He appears well-developed and well-nourished. No distress.    HENT:  Right Ear: Hearing, tympanic membrane, external ear and ear canal normal.  Left Ear: Hearing, tympanic membrane, external ear and ear canal normal.  Nose: Mucosal edema (beefy red) present. No sinus tenderness. Right sinus exhibits no maxillary sinus tenderness and no frontal sinus tenderness. Left sinus exhibits no maxillary sinus tenderness and no frontal sinus tenderness.  Mouth/Throat: Uvula is midline and mucous membranes are normal. Mucous membranes are not pale, not dry and not cyanotic. Posterior oropharyngeal erythema present. No oropharyngeal exudate, posterior oropharyngeal edema or tonsillar abscesses.  Cardiovascular: Normal rate and regular rhythm.   Respiratory: Effort normal and breath sounds normal. He has no wheezes. He has no rales.  Musculoskeletal: Normal range of motion.  Lymphadenopathy:    He has no cervical adenopathy.  Neurological: He is alert and oriented to person, place, and time.  Skin: Skin is warm and dry. He is not diaphoretic.  Psychiatric: He has a normal mood and affect.    Results for orders placed or performed in visit on 05/23/15 (from the past 24 hour(s))  POCT rapid strep A     Status: None   Collection Time: 05/23/15  9:42 AM  Result Value Ref Range   Rapid Strep A Screen Negative Negative    ASSESSMENT & PLAN  Earl Meyer was seen today for sore throat and nasal congestion.  Diagnoses and all orders for this visit:  Sore throat Orders: -     POCT rapid strep A -     Culture, Group A Strep  Nasal congestion  Viral URI with cough Orders: -  Codeine Polt-Chlorphen Polt ER (TUZISTRA XR) 14.7-2.8 MG/5ML SUER; Take 10 mLs by mouth at bedtime. -     naproxen (EC-NAPROSYN) 500 MG EC tablet; Take 1 tablet (500 mg total) by mouth 2 (two) times daily with a meal. -     cetirizine-pseudoephedrine (ZYRTEC-D ALLERGY & CONGESTION) 5-120 MG per tablet; Take 1 tablet by mouth every morning. Do not use OTC cold remedies with this  medication.    The patient was advised to call or come back to clinic if he does not see an improvement in symptoms, or worsens with the above plan.   Deliah Boston, MHS, PA-C [facility] 05/23/2015 9:45 AM

## 2015-05-24 LAB — CULTURE, GROUP A STREP: ORGANISM ID, BACTERIA: NORMAL

## 2015-06-16 IMAGING — CR DG KNEE COMPLETE 4+V*L*
5 series · 5 of 5 positions shown · non-contrast
Comparison: None.

CLINICAL DATA: Knee pain for 1 week. No trauma history submitted.

EXAM:
LEFT KNEE - COMPLETE 4+ VIEW

[AP]
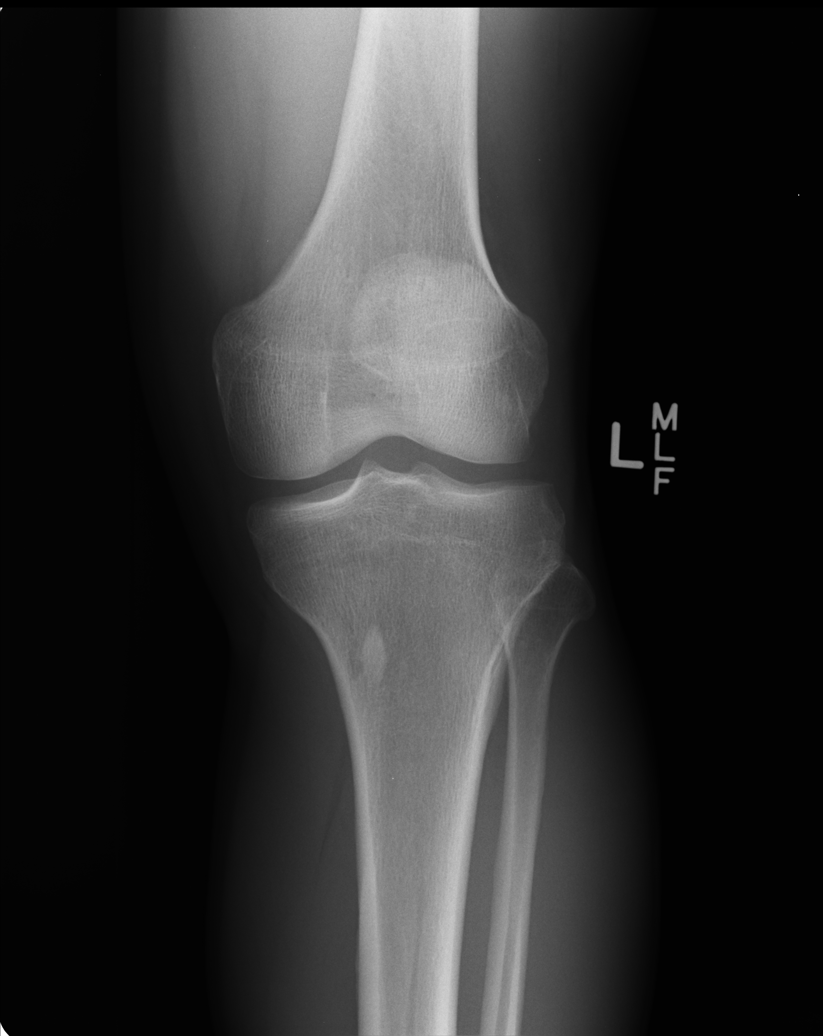

[lateral]
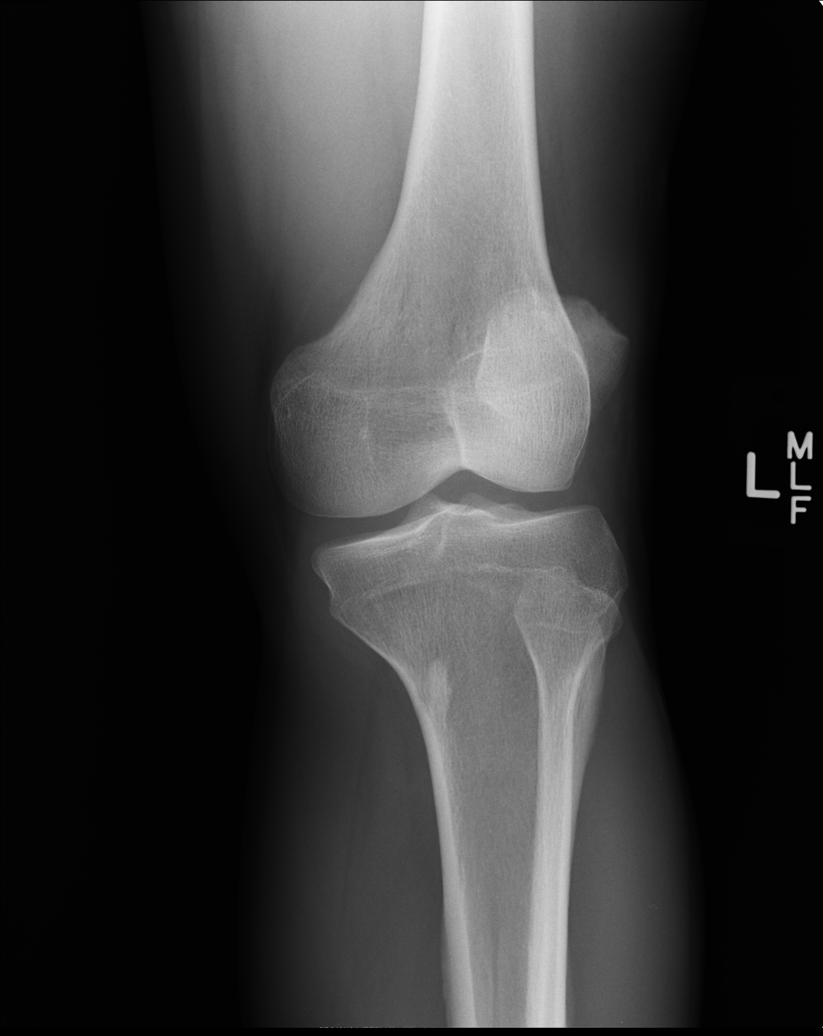

[ap ext rot]
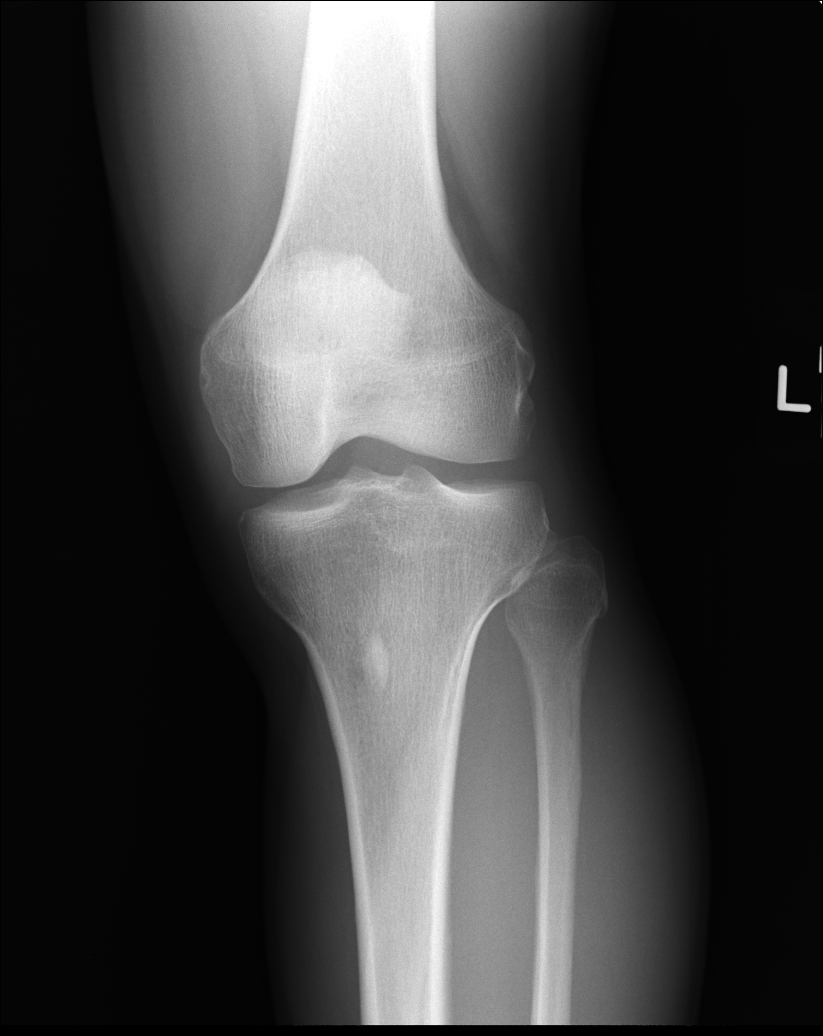

[ap int rot]
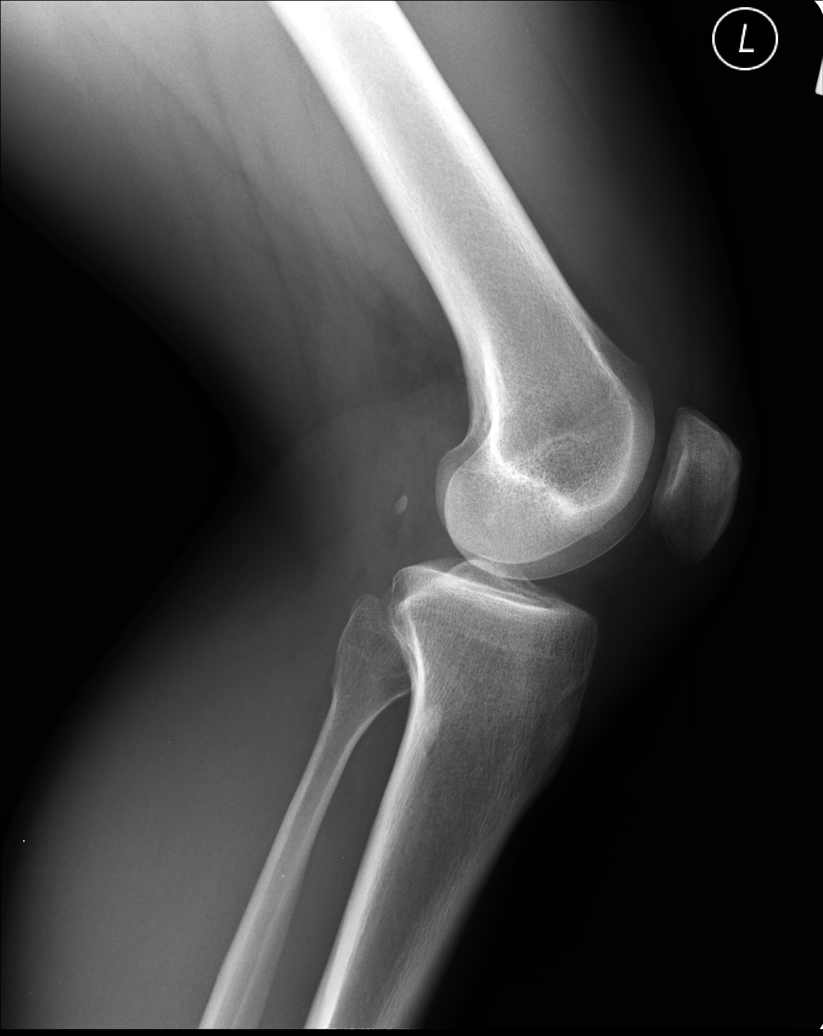

[sunrise]
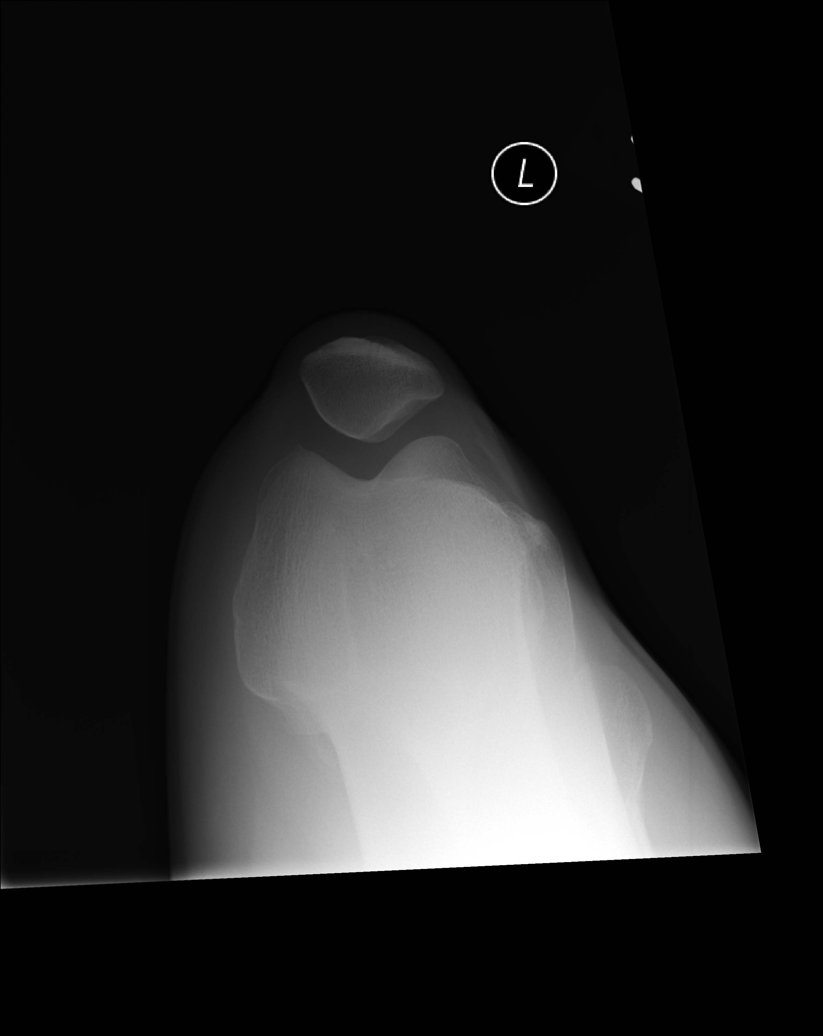

[5 of 5 positions shown; findings below may reference images not displayed]

FINDINGS: Incidental note is made of a fibrous cortical defect involving the
posterior proximal tibial metadiaphysis. This measures 1.4 cm.

No acute fracture or dislocation. No joint effusion. Joint spaces
maintained.
IMPRESSION: No acute osseous abnormality.

## 2015-10-05 ENCOUNTER — Ambulatory Visit (INDEPENDENT_AMBULATORY_CARE_PROVIDER_SITE_OTHER): Payer: 59 | Admitting: Family Medicine

## 2015-10-05 VITALS — BP 108/60 | HR 60 | Temp 98.0°F | Resp 16 | Ht 64.0 in | Wt 149.0 lb

## 2015-10-05 DIAGNOSIS — Z758 Other problems related to medical facilities and other health care: Secondary | ICD-10-CM

## 2015-10-05 DIAGNOSIS — Z789 Other specified health status: Secondary | ICD-10-CM

## 2015-10-05 DIAGNOSIS — R059 Cough, unspecified: Secondary | ICD-10-CM

## 2015-10-05 DIAGNOSIS — J069 Acute upper respiratory infection, unspecified: Secondary | ICD-10-CM | POA: Diagnosis not present

## 2015-10-05 DIAGNOSIS — J029 Acute pharyngitis, unspecified: Secondary | ICD-10-CM | POA: Diagnosis not present

## 2015-10-05 DIAGNOSIS — R12 Heartburn: Secondary | ICD-10-CM | POA: Diagnosis not present

## 2015-10-05 DIAGNOSIS — R05 Cough: Secondary | ICD-10-CM | POA: Diagnosis not present

## 2015-10-05 MED ORDER — AZITHROMYCIN 250 MG PO TABS: ORAL_TABLET | ORAL | Status: DC

## 2015-10-05 NOTE — Progress Notes (Signed)
Subjective:  This chart was scribed for Earl Staggers, MD by Stann Ore, Medical Scribe. This patient was seen in room 4 and the patient's care was started 9:32 AM.   Patient ID: Earl Meyer, male    DOB: 1976-03-01, 39 y.o.   MRN: [id number]  HPI Earl Meyer is a 39 y.o. male Pt states having a cough with phlegm in morning with sore, dry throat and nasal congestion that improves after a couple hours going on for a week. He noticed that his symptoms would return when he's outside working without a jacket. Last week felt worse while this week has improved, but the coughs have worsened. He's taken robitussin for mild relief. He denies myalgia, fever, shortness of breath. He denies sick contact at home or work. He's allergic to tessalon.   He also notes having some discomfort after eating cheese pizza at a specific restaurant. He denies having acid reflux at this time.   There are no active problems to display for this patient.  Past Medical History  Diagnosis Date  . Allergy    No past surgical history on file. Allergies  Allergen Reactions  . Penicillins     childhood  . Tessalon [Benzonatate] Dermatitis    Abdomen numb and severe dry mouth   Prior to Admission medications   Not on File   Social History   Social History  . Marital Status: Married    Spouse Name: N/A  . Number of Children: N/A  . Years of Education: N/A   Occupational History  . Not on file.   Social History Main Topics  . Smoking status: Never Smoker   . Smokeless tobacco: Never Used  . Alcohol Use: No  . Drug Use: No  . Sexual Activity: Yes    Birth Control/ Protection: None   Other Topics Concern  . Not on file   Social History Narrative    Review of Systems  Constitutional: Negative for fever.  HENT: Positive for congestion and sore throat. Negative for rhinorrhea and sneezing.   Respiratory: Positive for cough. Negative for shortness of breath.     Musculoskeletal: Negative for myalgias.       Objective:   Physical Exam  Constitutional: He is oriented to person, place, and time. He appears well-developed and well-nourished.  HENT:  Head: Normocephalic and atraumatic.  Right Ear: Tympanic membrane, external ear and ear canal normal.  Left Ear: Tympanic membrane, external ear and ear canal normal.  Nose: No rhinorrhea.  Mouth/Throat: Oropharynx is clear and moist and mucous membranes are normal. No oropharyngeal exudate or posterior oropharyngeal erythema.  Eyes: Conjunctivae are normal. Pupils are equal, round, and reactive to light.  Neck: Neck supple.  Cardiovascular: Normal rate, regular rhythm, normal heart sounds and intact distal pulses.   No murmur heard. Pulmonary/Chest: Effort normal and breath sounds normal. He has no wheezes. He has no rhonchi. He has no rales.  Abdominal: Soft. There is no tenderness.  Lymphadenopathy:    He has no cervical adenopathy.  Neurological: He is alert and oriented to person, place, and time.  Skin: Skin is warm and dry. No rash noted.  Psychiatric: He has a normal mood and affect. His behavior is normal.  Vitals reviewed.   Filed Vitals:   10/05/15 0838  BP: 108/60  Pulse: 60  Temp: 98 F (36.7 C)  TempSrc: Oral  Resp: 16  Height: 5\' 4"  (1.626 m)  Weight: 149 lb (67.586 kg)  SpO2: 98%  Assessment & Plan:   Shunsuke Granzow is a 39 y.o. male Acute upper respiratory infection, Sore throat, Cough - Plan: azithromycin (ZITHROMAX) 250 MG tablet  -suspected viral URI, reassuring exam.  -sx care with mucinex, but if cough not improving going into 3 weeks (later this week) can fill Zpak.   -rtc precautions.   Heartburn  -trigger avoidance, otc zantac if needed, rtc precautions.   Language barrier   -spanish spoken, handout in spanish and understanding expressed  Meds ordered this encounter  Medications  . azithromycin (ZITHROMAX) 250 MG tablet    Sig:  Take 2 pills by mouth on day 1, then 1 pill by mouth per day on days 2 through 5.    Dispense:  6 tablet    Refill:  0    Label in spanish   Patient Instructions  mucinex 2 veces cada dia si necesario.  si no esta mejor en 1 semana - empiece antibiotico azithromycin, pero ahora no creo necesario.   si tiene problemas de acido - tome Zantac, pero es mejor no come tipos de comida que causa estas simptomas.   Infeccin del tracto respiratorio superior, adultos (Upper Respiratory Infection, Adult) La mayora de las infecciones del tracto respiratorio superior son infecciones virales de las vas que llevan el aire a los pulmones. Un infeccin del tracto respiratorio superior afecta la nariz, la garganta y las vas respiratorias superiores. El tipo ms frecuente de infeccin del tracto respiratorio superior es la nasofaringitis, que habitualmente se conoce como "resfro comn". Las infecciones del tracto respiratorio superior siguen su curso y por lo general se curan solas. En la [employer], la infeccin del tracto respiratorio superior no requiere atencin [place], Biomedical engineer a veces, despus de una infeccin viral, puede surgir una infeccin bacteriana en las vas respiratorias superiores. Esto se conoce como infeccin secundaria. Las infecciones sinusales y en el odo medio son tipos frecuentes de infecciones secundarias en el tracto respiratorio superior. La neumona bacteriana tambin puede complicar un cuadro de infeccin del tracto respiratorio superior. Este tipo de infeccin puede empeorar el asma y la enfermedad pulmonar obstructiva crnica (EPOC). En algunos casos, estas complicaciones pueden requerir atencin mdica de emergencia y poner en peligro la vida.  CAUSAS Casi todas las infecciones del tracto respiratorio superior se deben a los virus. Un virus es un tipo de microbio que puede contagiarse de Neomia Dear persona a Educational psychologist.  FACTORES DE RIESGO Puede estar en riesgo de sufrir una infeccin  del tracto respiratorio superior si:   Fuma.  Tiene una enfermedad pulmonar o cardaca crnica.  Tiene debilitado el sistema de defensa (inmunitario) del cuerpo.  Es [address] o de edad muy [place].  Tiene asma o alergias nasales.  Trabaja en reas donde hay mucha gente o poca ventilacin.  Rudi Coco en una escuela o en un centro de atencin mdica. SIGNOS Y SNTOMAS  Habitualmente, los sntomas aparecen de 2a 3das despus de entrar en contacto con el virus del resfro. La mayora de las infecciones virales en el tracto respiratorio superior duran de 7a 10das. Sin embargo, las infecciones virales en el tracto respiratorio superior a causa del virus de la gripe pueden durar de 14a 18das y, habitualmente, son ms graves. Entre los sntomas se pueden incluir los siguientes:   Secrecin o congestin nasal.  Estornudos.  Tos.  Dolor de Advertising copywriter.  Dolor de [place].  Fatiga.  Grant Ruts.  Prdida del apetito.  Dolor en la frente, detrs de los ojos y por encima de los  pmulos (dolor sinusal).  Dolores musculares. DIAGNSTICO  El mdico puede diagnosticar una infeccin del tracto respiratorio superior mediante los siguientes estudios:  Examen fsico.  Pruebas para verificar si los sntomas no se deben a otra afeccin, por ejemplo:  Faringitis estreptoccica.  Sinusitis.  Neumona.  Asma. TRATAMIENTO  Esta infeccin desaparece sola, con el tiempo. No puede curarse con medicamentos, pero a menudo se prescriben para aliviar los sntomas. Los medicamentos pueden ser tiles para lo siguiente:  Personal assistantBajar la fiebre.  Reducir la tos.  Aliviar la congestin nasal. INSTRUCCIONES PARA EL CUIDADO EN EL HOGAR   Tome los medicamentos solamente como se lo haya indicado el mdico.  A fin de Engineer, materialsaliviar el dolor de garganta, haga grgaras con solucin salina templada o consuma caramelos para la tos, como se lo haya indicado el mdico.  Use un humidificador de vapor clido o inhale  el vapor de la ducha para aumentar la humedad del aire. Esto facilitar la respiracin.  Beba suficiente lquido para Photographermantener la orina clara o de color amarillo plido.  Consuma sopas y otros caldos transparentes, y Abbott Laboratoriesalimntese bien.  Descanse todo lo que sea necesario.  Regrese al Aleen Campitrabajo cuando la temperatura se le haya normalizado o cuando el mdico lo autorice. Es posible que deba quedarse en su casa durante un tiempo prolongado, para no infectar a los dems. Tambin puede usar un barbijo y lavarse las manos con cuidado para Transport plannerevitar la propagacin del virus.  Aumente el uso del inhalador si tiene asma.  No consuma ningn producto que contenga tabaco, lo que incluye cigarrillos, tabaco de Theatre managermascar o Administrator, Civil Servicecigarrillos electrnicos. Si necesita ayuda para dejar de fumar, consulte al American Expressmdico. PREVENCIN  La mejor manera de protegerse de un resfro es mantener una higiene [place].   Evite el contacto oral o fsico con personas que tengan sntomas de resfro.  En caso de contacto, lvese las manos con frecuencia. No hay pruebas claras de que la vitaminaC, la vitaminaE, la equincea o el ejercicio reduzcan la probabilidad de Primary school teachercontraer un resfro. Sin embargo, siempre se recomienda Insurance account managerdescansar mucho, hacer ejercicio y Engineering geologistalimentarse bien.  SOLICITE ATENCIN MDICA SI:   Su estado empeora en lugar de mejorar.  Los medicamentos no Estate agentlogran controlar los sntomas.  Tiene escalofros.  La sensacin de falta de aire empeora.  Tiene mucosidad marrn o roja.  Tiene secrecin nasal amarilla o marrn.  Le duele la cara, especialmente al inclinarse hacia adelante.  Tiene fiebre.  Tiene los ganglios del cuello hinchados.  Siente dolor al tragar.  Tiene zonas blancas en la parte de atrs de la garganta. SOLICITE ATENCIN MDICA DE INMEDIATO SI:   Tiene sntomas intensos o persistentes de:  Dolor de Turkmenistancabeza.  Dolor de odos.  Dolor sinusal.  Dolor en el pecho.  Tiene enfermedad pulmonar crnica  y cualquiera de estos sntomas:  Sibilancias.  Tos prolongada.  Tos con sangre.  Cambio en la mucosidad habitual.  Presenta rigidez en el cuello.  Tiene cambios en:  La visin.  La audicin.  El pensamiento.  El [place] de nimo. ASEGRESE DE QUE:   Comprende estas instrucciones.  Controlar su afeccin.  Recibir ayuda de inmediato si no mejora o si empeora.   Esta informacin no tiene Theme park managercomo fin reemplazar el consejo del mdico. Asegrese de hacerle al mdico cualquier pregunta que tenga.   Document Released: 09/01/2005 Document Revised: 04/08/2015 Elsevier Interactive Patient Education 2016 [employer]  Opciones de alimentos para pacientes con reflujo gastroesofgico - Adultos (Food Choices for Gastroesophageal Reflux Disease,  Adult) Cuando se tiene reflujo gastroesofgico (ERGE), los alimentos que se ingieren y los hbitos de alimentacin son muy importantes. Elegir los alimentos adecuados puede ayudar a Paramedic las molestias ocasionadas por el [place]. QU PAUTAS GENERALES DEBO SEGUIR?  Elija las frutas, los vegetales, los cereales integrales, los productos lcteos, la carne de [place], de pescado y de ave con bajo contenido de grasas.  Limite las grasas, [address] [place], los aderezos para [place], la [place], los frutos secos y Programme researcher, broadcasting/film/video.  Lleve un registro de las comidas para identificar los alimentos que ocasionan sntomas.  Evite los alimentos que le ocasionen reflujo. Pueden ser distintos para cada persona.  Haga comidas pequeas con frecuencia en lugar de tres comidas [employer].  Coma lentamente, en un clima distendido.  Limite el consumo de alimentos fritos.  Cocine los alimentos utilizando mtodos que no sean la fritura.  Evite el consumo alcohol.  Evite beber grandes cantidades de lquidos con las comidas.  Evite agacharse o recostarse hasta despus de 2 o 3horas de haber comido. QU ALIMENTOS NO SE RECOMIENDAN? Los  siguientes son algunos alimentos y bebidas que pueden empeorar los sntomas: Veterinary surgeon. Jugo de tomate. Salsa de tomate y espagueti. Ajes. Cebolla y [place]. Rbano picante. Frutas Naranjas, pomelos y limn (fruta y [place]). Carnes Carnes de [place], de pescado y de ave con gran contenido de grasas. Esto incluye los perros calientes, las [place], el [place], la salchicha, el salame y el tocino. Lcteos Leche entera y [place]. [employer]. Crema. Mantequilla. Helados. Queso crema.  Bebidas Caf y t negro, con o sin cafena Bebidas gaseosas o energizantes. Condimentos Salsa picante. Salsa barbacoa.  Dulces/postres Chocolate y cacao. Rosquillas. Menta y mentol. Grasas y [employer] con alto contenido de grasas, incluidas las papas fritas. Otros Vinagre. Especias picantes, como la [employer], la pimienta blanca, la pimienta roja, la pimienta de cayena, el curry en [place], los clavos de [place], el jengibre y el [place] en polvo. Los artculos mencionados arriba pueden no ser [employer] de las bebidas y los alimentos que se Theatre stage manager. Comunquese con el nutricionista para recibir ms informacin.   Esta informacin no tiene Theme park manager el consejo del mdico. Asegrese de hacerle al mdico cualquier pregunta que tenga.   Document Released: 09/01/2005 Document Revised: 12/13/2014 Elsevier Interactive Patient Education [employer]         signing my name below, I, Stann Ore, attest that this documentation has been prepared under the direction and in the presence of Earl Staggers, MD. Electronically Signed: Stann Ore, Scribe. 10/05/2015 , 9:32 AM . I personally performed the services described in this documentation, which was scribed in my presence. The recorded information has been reviewed and considered, and addended by me as needed.

## 2015-10-05 NOTE — Patient Instructions (Signed)
mucinex 2 veces cada dia si necesario.  si no esta mejor en 1 semana - empiece antibiotico azithromycin, pero ahora no creo necesario.   si tiene problemas de acido - tome Zantac, pero es mejor no come tipos de comida que causa estas simptomas.   Infeccin del tracto respiratorio superior, adultos (Upper Respiratory Infection, Adult) La mayora de las infecciones del tracto respiratorio superior son infecciones virales de las vas que llevan el aire a los pulmones. Un infeccin del tracto respiratorio superior afecta la nariz, la garganta y las vas respiratorias superiores. El tipo ms frecuente de infeccin del tracto respiratorio superior es la nasofaringitis, que habitualmente se conoce como "resfro comn". Las infecciones del tracto respiratorio superior siguen su curso y por lo general se curan solas. En la International Business Machinesmayora de los casos, la infeccin del tracto respiratorio superior no requiere atencin [place] Citymdica, Biomedical engineerpero a veces, despus de una infeccin viral, puede surgir una infeccin bacteriana en las vas respiratorias superiores. Esto se conoce como infeccin secundaria. Las infecciones sinusales y en el odo medio son tipos frecuentes de infecciones secundarias en el tracto respiratorio superior. La neumona bacteriana tambin puede complicar un cuadro de infeccin del tracto respiratorio superior. Este tipo de infeccin puede empeorar el asma y la enfermedad pulmonar obstructiva crnica (EPOC). En algunos casos, estas complicaciones pueden requerir atencin mdica de emergencia y poner en peligro la vida.  CAUSAS Casi todas las infecciones del tracto respiratorio superior se deben a los virus. Un virus es un tipo de microbio que puede contagiarse de Neomia Dearuna persona a Educational psychologistotra.  FACTORES DE RIESGO Puede estar en riesgo de sufrir una infeccin del tracto respiratorio superior si:   Fuma.  Tiene una enfermedad pulmonar o cardaca crnica.  Tiene debilitado el sistema de defensa (inmunitario) del  cuerpo.  Es [address]oven o de edad muy [place].  Tiene asma o alergias nasales.  Trabaja en reas donde hay mucha gente o poca ventilacin.  Rudi Cocorabaja en una escuela o en un centro de atencin mdica. SIGNOS Y SNTOMAS  Habitualmente, los sntomas aparecen de 2a 3das despus de entrar en contacto con el virus del resfro. La mayora de las infecciones virales en el tracto respiratorio superior duran de 7a 10das. Sin embargo, las infecciones virales en el tracto respiratorio superior a causa del virus de la gripe pueden durar de 14a 18das y, habitualmente, son ms graves. Entre los sntomas se pueden incluir los siguientes:   Secrecin o congestin nasal.  Estornudos.  Tos.  Dolor de Advertising copywritergarganta.  Dolor de Turk[place]ancabeza.  Fatiga.  Grant RutsFiebre.  Prdida del apetito.  Dolor en la frente, detrs de los ojos y por encima de los pmulos (dolor sinusal).  Dolores musculares. DIAGNSTICO  El mdico puede diagnosticar una infeccin del tracto respiratorio superior mediante los siguientes estudios:  Examen fsico.  Pruebas para verificar si los sntomas no se deben a otra afeccin, por ejemplo:  Faringitis estreptoccica.  Sinusitis.  Neumona.  Asma. TRATAMIENTO  Esta infeccin desaparece sola, con el tiempo. No puede curarse con medicamentos, pero a menudo se prescriben para aliviar los sntomas. Los medicamentos pueden ser tiles para lo siguiente:  Personal assistantBajar la fiebre.  Reducir la tos.  Aliviar la congestin nasal. INSTRUCCIONES PARA EL CUIDADO EN EL HOGAR   Tome los medicamentos solamente como se lo haya indicado el mdico.  A fin de Engineer, materialsaliviar el dolor de garganta, haga grgaras con solucin salina templada o consuma caramelos para la tos, como se lo haya indicado el mdico.  Use un humidificador  de vapor clido o inhale el vapor de la ducha para aumentar la humedad del aire. Esto facilitar la respiracin.  Beba suficiente lquido para Photographer orina clara o de color  amarillo plido.  Consuma sopas y otros caldos transparentes, y [employer].  Descanse todo lo que sea necesario.  Regrese al Aleen Campi cuando la temperatura se le haya normalizado o cuando el mdico lo autorice. Es posible que deba quedarse en su casa durante un tiempo prolongado, para no infectar a los dems. Tambin puede usar un barbijo y lavarse las manos con cuidado para Transport planner propagacin del virus.  Aumente el uso del inhalador si tiene asma.  No consuma ningn producto que contenga tabaco, lo que incluye cigarrillos, tabaco de Theatre manager o Administrator, Civil Service. Si necesita ayuda para dejar de fumar, consulte al [employer]. PREVENCIN  La mejor manera de protegerse de un resfro es mantener una higiene [place].   Evite el contacto oral o fsico con personas que tengan sntomas de resfro.  En caso de contacto, lvese las manos con frecuencia. No hay pruebas claras de que la vitaminaC, la vitaminaE, la equincea o el ejercicio reduzcan la probabilidad de Primary school teacher un resfro. Sin embargo, siempre se recomienda Insurance account manager, hacer ejercicio y Engineering geologist.  SOLICITE ATENCIN MDICA SI:   Su estado empeora en lugar de mejorar.  Los medicamentos no Estate agent.  Tiene escalofros.  La sensacin de falta de aire empeora.  Tiene mucosidad marrn o roja.  Tiene secrecin nasal amarilla o marrn.  Le duele la cara, especialmente al inclinarse hacia adelante.  Tiene fiebre.  Tiene los ganglios del cuello hinchados.  Siente dolor al tragar.  Tiene zonas blancas en la parte de atrs de la garganta. SOLICITE ATENCIN MDICA DE INMEDIATO SI:   Tiene sntomas intensos o persistentes de:  Dolor de [place].  Dolor de odos.  Dolor sinusal.  Dolor en el pecho.  Tiene enfermedad pulmonar crnica y cualquiera de estos sntomas:  Sibilancias.  Tos prolongada.  Tos con sangre.  Cambio en la mucosidad habitual.  Presenta rigidez en el  cuello.  Tiene cambios en:  La visin.  La audicin.  El pensamiento.  El [place] de nimo. ASEGRESE DE QUE:   Comprende estas instrucciones.  Controlar su afeccin.  Recibir ayuda de inmediato si no mejora o si empeora.   Esta informacin no tiene Theme park manager el consejo del mdico. Asegrese de hacerle al mdico cualquier pregunta que tenga.   Document Released: 09/01/2005 Document Revised: 04/08/2015 Elsevier Interactive Patient Education 2016 [employer].  Opciones de alimentos para pacientes con reflujo gastroesofgico - Adultos (Food Choices for Gastroesophageal Reflux Disease, Adult) Cuando se tiene reflujo gastroesofgico (ERGE), los alimentos que se ingieren y los hbitos de alimentacin son Engineer, production. Elegir los alimentos adecuados puede ayudar a Paramedic las molestias ocasionadas por el [place]. QU PAUTAS GENERALES DEBO SEGUIR?  Elija las frutas, los vegetales, los cereales integrales, los productos lcteos, la carne de [place], de pescado y de ave con bajo contenido de grasas.  Limite las grasas, [address] [place], los aderezos para [place], la [place], los frutos secos y Programme researcher, broadcasting/film/video.  Lleve un registro de las comidas para identificar los alimentos que ocasionan sntomas.  Evite los alimentos que le ocasionen reflujo. Pueden ser distintos para cada persona.  Haga comidas pequeas con frecuencia en lugar de tres comidas [employer].  Coma lentamente, en un clima distendido.  Limite el consumo de alimentos fritos.  Cocine los alimentos  utilizando mtodos que no sean la fritura.  Evite el consumo alcohol.  Evite beber grandes cantidades de lquidos con las comidas.  Evite agacharse o recostarse hasta despus de 2 o 3horas de haber comido. QU ALIMENTOS NO SE RECOMIENDAN? Los siguientes son algunos alimentos y bebidas que pueden empeorar los sntomas: Veterinary surgeon. Jugo de tomate. Salsa de tomate y espagueti. Ajes. Cebolla  y [place]. Rbano picante. Frutas Naranjas, pomelos y limn (fruta y [place]). Carnes Carnes de [place], de pescado y de ave con gran contenido de grasas. Esto incluye los perros calientes, las [place], el [place], la salchicha, el salame y el tocino. Lcteos Leche entera y [place]. [employer]. Crema. Mantequilla. Helados. Queso crema.  Bebidas Caf y t negro, con o sin cafena Bebidas gaseosas o energizantes. Condimentos Salsa picante. Salsa barbacoa.  Dulces/postres Chocolate y cacao. Rosquillas. Menta y mentol. Grasas y [employer] con alto contenido de grasas, incluidas las papas fritas. Otros Vinagre. Especias picantes, como la [employer], la pimienta blanca, la pimienta roja, la pimienta de cayena, el curry en [place], los clavos de [place], el jengibre y el [place] en polvo. Los artculos mencionados arriba pueden no ser [employer] de las bebidas y los alimentos que se Theatre stage manager. Comunquese con el nutricionista para recibir ms informacin.   Esta informacin no tiene Theme park manager el consejo del mdico. Asegrese de hacerle al mdico cualquier pregunta que tenga.   Document Released: 09/01/2005 Document Revised: 12/13/2014 Elsevier Interactive Patient Education [employer]

## 2016-11-04 ENCOUNTER — Encounter: Payer: Self-pay | Admitting: Physician Assistant

## 2016-11-04 ENCOUNTER — Ambulatory Visit (INDEPENDENT_AMBULATORY_CARE_PROVIDER_SITE_OTHER): Payer: BLUE CROSS/BLUE SHIELD | Admitting: Physician Assistant

## 2016-11-04 VITALS — BP 114/72 | HR 75 | Temp 97.9°F | Resp 17 | Ht 64.0 in | Wt 150.0 lb

## 2016-11-04 DIAGNOSIS — B349 Viral infection, unspecified: Secondary | ICD-10-CM | POA: Diagnosis not present

## 2016-11-04 DIAGNOSIS — J029 Acute pharyngitis, unspecified: Secondary | ICD-10-CM | POA: Diagnosis not present

## 2016-11-04 DIAGNOSIS — R05 Cough: Secondary | ICD-10-CM

## 2016-11-04 DIAGNOSIS — R0981 Nasal congestion: Secondary | ICD-10-CM

## 2016-11-04 DIAGNOSIS — R059 Cough, unspecified: Secondary | ICD-10-CM

## 2016-11-04 LAB — POCT INFLUENZA A/B
Influenza A, POC: NEGATIVE
Influenza B, POC: NEGATIVE

## 2016-11-04 MED ORDER — FLUTICASONE PROPIONATE 50 MCG/ACT NA SUSP: 2.0000 | Freq: Every day | NASAL | 12 refills | Status: DC

## 2016-11-04 MED ORDER — GUAIFENESIN ER 1200 MG PO TB12: 1.0000 | ORAL_TABLET | Freq: Two times a day (BID) | ORAL | 1 refills | Status: DC | PRN

## 2016-11-04 MED ORDER — HYDROCOD POLST-CPM POLST ER 10-8 MG/5ML PO SUER: 5.0000 mL | Freq: Two times a day (BID) | ORAL | 0 refills | Status: DC | PRN

## 2016-11-04 NOTE — Progress Notes (Signed)
Earl Meyer  MRN: [id number] DOB: 02/02/1976  PCP: No PCP Per Patient  Subjective:  Pt is a 40 year old male who presents to clinic for illness. Started two days ago as dry throat and has progressed to b/l ear pain and sinus pressure. Started coughing last night and this morning, reports a runny nose. Describes his cough as dry and non-productive. He has a headache and is tired. He is sleeping well, however reports his throat is very dry when he wakes up in the morning. Eating and drinking well.  Sick contacts at work. Has taken tylenol and ibuprofen, not helping much. Has received his flu shot this season.   Review of Systems  Constitutional: Negative for chills, diaphoresis and fever.  HENT: Positive for congestion, ear pain, sinus pressure and sore throat. Negative for sinus pain.   Respiratory: Positive for cough. Negative for chest tightness, shortness of breath and wheezing.   Cardiovascular: Negative for chest pain and palpitations.  Gastrointestinal: Negative for abdominal pain, diarrhea, nausea and vomiting.  Neurological: Positive for headaches. Negative for dizziness and light-headedness.  Psychiatric/Behavioral: Negative for sleep disturbance.    There are no active problems to display for this patient.   No current outpatient prescriptions on file prior to visit.   No current facility-administered medications on file prior to visit.     Allergies  Allergen Reactions  . Penicillins     childhood  . Tessalon [Benzonatate] Dermatitis    Abdomen numb and severe dry mouth     Objective:  BP 114/72 (BP Location: Left Arm, Patient Position: Sitting, Cuff Size: Normal)   Pulse 75   Temp 97.9 F (36.6 C) (Oral)   Resp 17   Ht 5\' 4"  (1.626 m)   Wt 150 lb (68 kg)   SpO2 98%   BMI 25.75 kg/m   Physical Exam  Constitutional: He is oriented to person, place, and time and well-developed, well-nourished, and in no distress. No distress.  HENT:  Right  Ear: Tympanic membrane normal.  Left Ear: Tympanic membrane normal.  Nose: Mucosal edema and rhinorrhea present. No sinus tenderness. Right sinus exhibits no maxillary sinus tenderness and no frontal sinus tenderness. Left sinus exhibits no maxillary sinus tenderness and no frontal sinus tenderness.  Mouth/Throat: Mucous membranes are normal. Posterior oropharyngeal edema present. No oropharyngeal exudate or posterior oropharyngeal erythema.  Cardiovascular: Normal rate, regular rhythm and normal heart sounds.   Pulmonary/Chest: Effort normal and breath sounds normal. No respiratory distress.  Neurological: He is alert and oriented to person, place, and time. GCS score is 15.  Skin: Skin is warm and dry.  Psychiatric: Mood, memory, affect and judgment normal.  Vitals reviewed.  Results for orders placed or performed in visit on 11/04/16  POCT Influenza A/B  Result Value Ref Range   Influenza A, POC Negative Negative   Influenza B, POC Negative Negative   Assessment and Plan :  1. Viral illness 2. Sore throat 3. Head congestion 4. Cough - POCT Influenza A/B - fluticasone (FLONASE) 50 MCG/ACT nasal spray; Place 2 sprays into both nostrils daily.  Dispense: 16 g; Refill: 12 - Guaifenesin (MUCINEX MAXIMUM STRENGTH) 1200 MG TB12; Take 1 tablet (1,200 mg total) by mouth every 12 (twelve) hours as needed.  Dispense: 14 tablet; Refill: 1 - chlorpheniramine-HYDROcodone (TUSSIONEX PENNKINETIC ER) 10-8 MG/5ML SUER; Take 5 mLs by mouth every 12 (twelve) hours as needed for cough.  Dispense: 100 mL; Refill: 0  - Supportive care encouraged: Drink plenty of fluids  and rest. Take medications as prescribed. Medication side-effects discussed. RTC in 5-7 days if no improvement.   Marco CollieWhitney Detron Carras, PA-C  [facility] 11/04/2016 9:13 AM

## 2016-11-04 NOTE — Patient Instructions (Addendum)
You have a viral infection. Please be sure to drink plenty of water - at least 2 liters a day - while you are getting better.Drink tea with honey, lemon and cloves. And get plenty of rest. Your medications are available at your pharmacy, please take as directed. Return to the clinic in 5-7 days if you are not better.  You can get a humidifier in your room and turn it on at night - this should help with your dry throat in the morning. You can buy one at your local pharmacy.    Thank you for coming in today. I hope you feel we met your needs.  Feel free to call [facility] if you have any questions or further requests.  Please consider signing up for MyChart if you do not already have it, as this is a great way to communicate with me.  Best,  Whitney McVey, PA-C     IF you received an x-ray today, you will receive an invoice from [facility]. Please contact [facility] at [phone] with questions or concerns regarding your invoice.   IF you received labwork today, you will receive an invoice from [employer]. Please contact [facility] at [phone] with questions or concerns regarding your invoice.   Our billing staff will not be able to assist you with questions regarding bills from these companies.  You will be contacted with the lab results as soon as they are available. The fastest way to get your results is to activate your My Chart account. Instructions are located on the last page of this paperwork. If you have not heard from [place] regarding the results in 2 weeks, please contact this office.

## 2016-12-16 ENCOUNTER — Ambulatory Visit (INDEPENDENT_AMBULATORY_CARE_PROVIDER_SITE_OTHER): Payer: BLUE CROSS/BLUE SHIELD | Admitting: Family Medicine

## 2016-12-16 VITALS — BP 110/76 | HR 103 | Temp 99.2°F | Resp 16 | Ht 63.0 in | Wt 150.0 lb

## 2016-12-16 DIAGNOSIS — R509 Fever, unspecified: Secondary | ICD-10-CM

## 2016-12-16 DIAGNOSIS — J111 Influenza due to unidentified influenza virus with other respiratory manifestations: Secondary | ICD-10-CM | POA: Diagnosis not present

## 2016-12-16 DIAGNOSIS — R059 Cough, unspecified: Secondary | ICD-10-CM

## 2016-12-16 DIAGNOSIS — R05 Cough: Secondary | ICD-10-CM

## 2016-12-16 MED ORDER — HYDROCODONE-HOMATROPINE 5-1.5 MG/5ML PO SYRP: 5.0000 mL | ORAL_SOLUTION | ORAL | 0 refills | Status: DC | PRN

## 2016-12-16 MED ORDER — OSELTAMIVIR PHOSPHATE 75 MG PO CAPS
75.0000 mg | ORAL_CAPSULE | Freq: Two times a day (BID) | ORAL | 0 refills | Status: DC
Start: 2016-12-16 — End: 2017-03-08

## 2016-12-16 MED ORDER — ACETAMINOPHEN 500 MG PO TABS
1000.0000 mg | ORAL_TABLET | Freq: Once | ORAL | Status: AC
  Administered 2016-12-16: 1000 mg via ORAL

## 2016-12-16 NOTE — Progress Notes (Signed)
Patient ID: Earl Meyer, male    DOB: 09/25/1976  Age: 41 y.o. MRN: [id number]  Chief Complaint  Patient presents with  . Cough    symptoms started 12/15/16  . muscle pain  . Headache    Subjective:   41 year old man who got sick today with fever and chills and aching coughing and runny nose. Actually it started a little bit yesterday. He does not smoke. He did get a flu shot in about October.  The chart says he is allergic to hydrocodone because of a rash from benzonatate in the past. I will go ahead and prescribe it.  Current allergies, medications, problem list, past/family and social histories reviewed.  Objective:  BP 110/76   Pulse (!) 103   Temp 99.2 F (37.3 C) (Oral)   Resp 16   Ht 5\' 3"  (1.6 m)   Wt 150 lb (68 kg)   SpO2 99%   BMI 26.57 kg/m   Ill-appearing, shivering and shaking and bundled up. Mild cough and congestion. Whenever he breathes in deep he coughs.  TMs normal. Throat clear. Neck supple without nodes. Chest clear. Heart regular without murmur. Abdomen soft without mass or tenderness  Assessment & Plan:   Assessment: 1. Fever and chills   2. Influenza   3. Cough       Plan: Looks like the flu, will treat accordingly  No orders of the defined types were placed in this encounter.   Meds ordered this encounter  Medications  . ibuprofen (ADVIL,MOTRIN) 200 MG tablet    Sig: Take 200 mg by mouth every 6 (six) hours as needed.  Marland Kitchen acetaminophen (TYLENOL) tablet 1,000 mg  . oseltamivir (TAMIFLU) 75 MG capsule    Sig: Take 1 capsule (75 mg total) by mouth 2 (two) times daily.    Dispense:  10 capsule    Refill:  0  . HYDROcodone-homatropine (HYCODAN) 5-1.5 MG/5ML syrup    Sig: Take 5 mLs by mouth every 4 (four) hours as needed.    Dispense:  120 mL    Refill:  0         Patient Instructions    Drink plenty of fluids and get enough rest  Take Tylenol (acetaminophen) 1000 mg every 8 hours as needed for fever and  aching And/or Ibuprofen(Motrin, Advil) 600 mg every 8 hours as needed for fever and aching  Take Tamiflu one twice daily for 5 days for the influenza  Return if worse   Gripe en los adultos (Influenza, Adult) La gripe es una infeccin viral que afecta principalmente las vas respiratorias. Las vas respiratorias incluyen rganos que ayudan a Industrial/product designer, [employer], la nariz y Administrator. La gripe provoca muchos sntomas del resfro comn, as como fiebre alta y Tourist information centre manager. Se transmite fcilmente de persona a persona (es contagiosa). La mejor manera de prevenir la gripe es aplicndose la vacuna contra la gripe todos los aos. CAUSAS La gripe es causada por un virus. Se puede contagiar el virus de las siguientes [place]:  Al aspirar las gotitas que una persona infectada elimina al toser o Engineering geologist.  Al tocar algo que fue recientemente contaminado con el virus y [employer] mano a la boca, la nariz o los ojos. FACTORES DE RIESGO Los siguientes factores pueden hacer que usted sea propenso a Primary school teacher la gripe:  No lavarse las manos frecuentemente con agua y [place] o un desinfectante de manos a base de alcohol.  Tener contacto cercano con [employer]  temporada de resfro y gripe.  Tocarse la boca, los ojos o la nariz sin lavarse ni desinfectarse las manos primero.  No beber suficientes lquidos o no seguir una dieta saludable.  No dormir lo suficiente o no hacer suficiente actividad fsica.  Tener un alto grado de estrs.  No colocarse la vacuna anual contra la gripe. Puede correr un mayor riesgo de complicaciones de la gripe, como una infeccin pulmonar grave (neumona) si:  Tiene ms de 65aos.  Est embarazada.  Tiene un sistema que combate las defensas (sistema inmunitario) debilitado. Puede tener un sistema inmunitario debilitado si:  Tiene VIH o sida.  Est recibiendo quimioterapia.  [place] medicamentos que reducen [employer] (suprimen)  del sistema inmunitario.  Tiene una enfermedad a largo plazo (crnica), como cardiopata coronaria, enfermedad renal, diabetes o enfermedad pulmonar.  Tiene un trastorno heptico.  Son obesas.  Tiene anemia. SNTOMAS Los sntomas de esta afeccin por lo general duran entre 4 y 76das, y Conservator, museum/gallery los siguientes sntomas:  Grant Ruts.  Escalofros.  Dolor de [place], dolores en el cuerpo o dolores musculares.  Dolor de Advertising copywriter.  Tos.  Secrecin o congestin nasal.  Molestias en el pecho y tos.  Prdida del apetito.  Debilidad o cansancio (fatiga).  Mareos.  Nuseas o vmitos. DIAGNSTICO Esta afeccin se puede diagnosticar en funcin de los antecedentes mdicos y un examen fsico. El mdico puede indicarle un cultivo farngeo o nasal para confirmar el diagnstico. TRATAMIENTO Si la gripe se detecta de manera temprana, puede recibir tratamiento con medicamentos antivirales que pueden reducir la duracin de la enfermedad y la gravedad de los sntomas. Este medicamento se puede administrar por va oral o por va intravenosa (IV), es decir, a travs de un tubo que se coloca en una vena. El objetivo del tratamiento es aliviar los sntomas cuidndose en su casa. Esto puede incluir usar medicamentos de venta libre, beber una gran cantidad de lquidos y agregar humedad al aire en su casa. En algunos casos, la gripe se cura sin tratamiento. Los casos graves o las complicaciones de gripe se pueden tratar en un hospital. INSTRUCCIONES PARA EL CUIDADO EN EL [employer] medicamentos de venta libre y los recetados solamente como se lo haya indicado el mdico.  Use un humidificador de aire fro para agregar humedad al aire de su casa. Esto puede ayudarlo a Solicitor.  Descanse todo lo que sea necesario.  Beba suficiente lquido para Photographer orina clara o de color amarillo plido.  Al toser o estornudar, cbrase la boca y la [place].  Lvese las manos con agua y jabn  frecuentemente, en especial despus de toser o Engineering geologist. Use desinfectante para manos si no dispone de [place] y [place].  Lanny Hurst en su casa y no concurra al Aleen Campi o a la escuela, como se lo haya indicado el mdico. A menos que visite al [employer], evite salir de su casa hasta que no tenga fiebre durante 24horas sin el uso de medicamentos.  Concurra a todas las visitas de control como se lo haya indicado el mdico. Esto es importante. PREVENCIN  Aplicarse la vacuna anual contra la gripe es la mejor manera de evitar contagiarse la gripe. Puede aplicarse la vacuna contra la gripe a fines de verano, en otoo o en invierno. Pregntele al mdico cundo debe aplicarse la vacuna contra la gripe.  Lvese las manos o use un desinfectante de manos con frecuencia.  Evite el contacto con personas que estn enfermas durante la temporada de resfro y  gripe.  Siga una dieta saludable, beba muchos lquidos, duerma lo suficiente y realice actividad fsica con regularidad. SOLICITE ATENCIN MDICA SI:  Presenta nuevos sntomas.  Tiene los siguientes sntomas:  Journalist, newspaperDolor en el pecho.  Diarrea.  Grant RutsFiebre.  La tos empeora.  Produce ms mucosidad.  Siente nuseas o vomita. SOLICITE ATENCIN MDICA DE INMEDIATO SI:  Le falta el aire o tiene dificultad para respirar.  La piel o las uas se tornan de un color [place].  Presenta dolor intenso o rigidez de cuello.  Le duele la cabeza de forma repentina o tiene dolor en la cara o el odo.  No puede dejar de vomitar. Esta informacin no tiene Theme park managercomo fin reemplazar el consejo del mdico. Asegrese de hacerle al mdico cualquier pregunta que tenga. Document Released: 09/01/2005 Document Revised: 03/15/2016 Document Reviewed: 09/16/2015 Elsevier Interactive Patient Education  2017 [employer]    IF you received an x-ray today, you will receive an invoice from [facility]. Please contact [facility] at [phone] with questions or  concerns regarding your invoice.   IF you received labwork today, you will receive an invoice from [facility]. Please contact [facility] at [phone] with questions or concerns regarding your invoice.   Our billing staff will not be able to assist you with questions regarding bills from these companies.  You will be contacted with the lab results as soon as they are available. The fastest way to get your results is to activate your My Chart account. Instructions are located on the last page of this paperwork. If you have not heard from us regarding the results in 2 weeks, please contact this office.        Return if symptoms worsen or fail to improve.   Bernadean Saling, MD 12/16/2016

## 2016-12-16 NOTE — Patient Instructions (Addendum)
Drink plenty of fluids and get enough rest  Take Tylenol (acetaminophen) 1000 mg every 8 hours as needed for fever and aching And/or Ibuprofen(Motrin, Advil) 600 mg every 8 hours as needed for fever and aching  Take Tamiflu one twice daily for 5 days for the influenza  Return if worse   Gripe en los adultos (Influenza, Adult) La gripe es una infeccin viral que afecta principalmente las vas respiratorias. Las vas respiratorias incluyen rganos que ayudan a Industrial/product designerrespirar, Toll Brotherscomo los pulmones, la nariz y Administratorla garganta. La gripe provoca muchos sntomas del resfro comn, as como fiebre alta y Tourist information centre managerdolor corporal. Se transmite fcilmente de persona a persona (es contagiosa). La mejor manera de prevenir la gripe es aplicndose la vacuna contra la gripe todos los aos. CAUSAS La gripe es causada por un virus. Se puede contagiar el virus de las siguientes [place]:  Al aspirar las gotitas que una persona infectada elimina al toser o Engineering geologistestornudar.  Al tocar algo que fue recientemente contaminado con el virus y Tenet Healthcareluego llevarse la mano a la boca, la nariz o los ojos. FACTORES DE RIESGO Los siguientes factores pueden hacer que usted sea propenso a Primary school teachercontraer la gripe:  No lavarse las manos frecuentemente con agua y [place] o un desinfectante de manos a base de alcohol.  Tener contacto cercano con [place] personas durante la temporada de resfro y gripe.  Tocarse la boca, los ojos o la nariz sin lavarse ni desinfectarse las manos primero.  No beber suficientes lquidos o no seguir una dieta saludable.  No dormir lo suficiente o no hacer suficiente actividad fsica.  Tener un alto grado de estrs.  No colocarse la vacuna anual contra la gripe. Puede correr un mayor riesgo de complicaciones de la gripe, como una infeccin pulmonar grave (neumona) si:  Tiene ms de 64aos.  Est embarazada.  Tiene un sistema que combate las defensas (sistema inmunitario) debilitado. Puede tener un sistema inmunitario  debilitado si:  Tiene VIH o sida.  Est recibiendo quimioterapia.  [place] medicamentos que reducen [facility] Health Carela actividad (suprimen) del sistema inmunitario.  Tiene una enfermedad a largo plazo (crnica), como cardiopata coronaria, enfermedad renal, diabetes o enfermedad pulmonar.  Tiene un trastorno heptico.  Son obesas.  Tiene anemia. SNTOMAS Los sntomas de esta afeccin por lo general duran entre 4 y 8910das, y Conservator, museum/gallerypueden tener los siguientes sntomas:  Grant RutsFiebre.  Escalofros.  Dolor de [place], dolores en el cuerpo o dolores musculares.  Dolor de Advertising copywritergarganta.  Tos.  Secrecin o congestin nasal.  Molestias en el pecho y tos.  Prdida del apetito.  Debilidad o cansancio (fatiga).  Mareos.  Nuseas o vmitos. DIAGNSTICO Esta afeccin se puede diagnosticar en funcin de los antecedentes mdicos y un examen fsico. El mdico puede indicarle un cultivo farngeo o nasal para confirmar el diagnstico. TRATAMIENTO Si la gripe se detecta de manera temprana, puede recibir tratamiento con medicamentos antivirales que pueden reducir la duracin de la enfermedad y la gravedad de los sntomas. Este medicamento se puede administrar por va oral o por va intravenosa (IV), es decir, a travs de un tubo que se coloca en una vena. El objetivo del tratamiento es aliviar los sntomas cuidndose en su casa. Esto puede incluir usar medicamentos de venta libre, beber una gran cantidad de lquidos y agregar humedad al aire en su casa. En algunos casos, la gripe se cura sin tratamiento. Los casos graves o las complicaciones de gripe se pueden tratar en un hospital. INSTRUCCIONES PARA EL CUIDADO EN EL [employer] Services CorporationHOGAR  Tome los  medicamentos de venta libre y los recetados solamente como se lo haya indicado el mdico.  Use un humidificador de aire fro para agregar humedad al aire de su casa. Esto puede ayudarlo a Solicitor.  Descanse todo lo que sea necesario.  Beba suficiente lquido para Photographer orina  clara o de color amarillo plido.  Al toser o estornudar, cbrase la boca y la [place].  Lvese las manos con agua y jabn frecuentemente, en especial despus de toser o Engineering geologist. Use desinfectante para manos si no dispone de [place] y [place].  Lanny Hurst en su casa y no concurra al Aleen Campi o a la escuela, como se lo haya indicado el mdico. A menos que visite al [employer], evite salir de su casa hasta que no tenga fiebre durante 24horas sin el uso de medicamentos.  Concurra a todas las visitas de control como se lo haya indicado el mdico. Esto es importante. PREVENCIN  Aplicarse la vacuna anual contra la gripe es la mejor manera de evitar contagiarse la gripe. Puede aplicarse la vacuna contra la gripe a fines de verano, en otoo o en invierno. Pregntele al mdico cundo debe aplicarse la vacuna contra la gripe.  Lvese las manos o use un desinfectante de manos con frecuencia.  Evite el contacto con personas que estn enfermas durante la temporada de resfro y gripe.  Siga una dieta saludable, beba muchos lquidos, duerma lo suficiente y realice actividad fsica con regularidad. SOLICITE ATENCIN MDICA SI:  Presenta nuevos sntomas.  Tiene los siguientes sntomas:  Journalist, newspaper.  Diarrea.  Grant Ruts.  La tos empeora.  Produce ms mucosidad.  Siente nuseas o vomita. SOLICITE ATENCIN MDICA DE INMEDIATO SI:  Le falta el aire o tiene dificultad para respirar.  La piel o las uas se tornan de un color [place].  Presenta dolor intenso o rigidez de cuello.  Le duele la cabeza de forma repentina o tiene dolor en la cara o el odo.  No puede dejar de vomitar. Esta informacin no tiene Theme park manager el consejo del mdico. Asegrese de hacerle al mdico cualquier pregunta que tenga. Document Released: 09/01/2005 Document Revised: 03/15/2016 Document Reviewed: 09/16/2015 Elsevier Interactive Patient Education  2017 [employer].    IF you received an x-ray today, you  will receive an invoice from [facility]. Please contact [facility] at [phone] with questions or concerns regarding your invoice.   IF you received labwork today, you will receive an invoice from [facility]. Please contact [facility] at [phone] with questions or concerns regarding your invoice.   Our billing staff will not be able to assist you with questions regarding bills from these companies.  You will be contacted with the lab results as soon as they are available. The fastest way to get your results is to activate your My Chart account. Instructions are located on the last page of this paperwork. If you have not heard from [place] regarding the results in 2 weeks, please contact this office.

## 2016-12-18 ENCOUNTER — Ambulatory Visit (INDEPENDENT_AMBULATORY_CARE_PROVIDER_SITE_OTHER): Payer: BLUE CROSS/BLUE SHIELD | Admitting: Family Medicine

## 2016-12-18 VITALS — BP 110/80 | HR 71 | Temp 98.1°F | Resp 17 | Ht 63.0 in | Wt 148.5 lb

## 2016-12-18 DIAGNOSIS — M791 Myalgia, unspecified site: Secondary | ICD-10-CM

## 2016-12-18 DIAGNOSIS — J111 Influenza due to unidentified influenza virus with other respiratory manifestations: Secondary | ICD-10-CM | POA: Diagnosis not present

## 2016-12-18 MED ORDER — CYCLOBENZAPRINE HCL 5 MG PO TABS: 5.0000 mg | ORAL_TABLET | Freq: Three times a day (TID) | ORAL | 0 refills | Status: DC | PRN

## 2016-12-18 MED ORDER — ONDANSETRON HCL 4 MG PO TABS: 4.0000 mg | ORAL_TABLET | Freq: Three times a day (TID) | ORAL | 0 refills | Status: DC | PRN

## 2016-12-18 NOTE — Progress Notes (Signed)
Chief Complaint  Patient presents with  . Nausea    was seen & treated on 12/16/16-sx's no better. Only cough med is working  . Back Pain  . Headache    HPI   Pt reports that he has been treated for nausea  His last visit was 12/16/2016 with Dr. Alwyn RenHopper He was given Tamiflu and nausea medication zofran He reports that the "antibiotic" is not working He reports that has headache, body aches and  He was also given Hycodan cough syrup and tylenol He was advised to drink plenty of fluids  Now he reports that he feels nauseous and very  Hot He states that he vomits when he has Earl fever He reports that his back hurts Earl lot  He is drinking plenty of water He reports that he has been vomiting and is not sure if the flu is the cause. He thought that the Tamiflu would relief his symptoms  He has been taking tylenol and advil with food   [id number] Spanish Interpretation   Past Medical History:  Diagnosis Date  . Allergy     Current Outpatient Prescriptions  Medication Sig Dispense Refill  . acetaminophen (TYLENOL) 325 MG tablet Take 650 mg by mouth every 6 (six) hours as needed.    . chlorpheniramine-HYDROcodone (TUSSIONEX PENNKINETIC ER) 10-8 MG/5ML SUER Take 5 mLs by mouth every 12 (twelve) hours as needed for cough. 100 mL 0  . fluticasone (FLONASE) 50 MCG/ACT nasal spray Place 2 sprays into both nostrils daily. 16 g 12  . Guaifenesin (MUCINEX MAXIMUM STRENGTH) 1200 MG TB12 Take 1 tablet (1,200 mg total) by mouth every 12 (twelve) hours as needed. 14 tablet 1  . HYDROcodone-homatropine (HYCODAN) 5-1.5 MG/5ML syrup Take 5 mLs by mouth every 4 (four) hours as needed. 120 mL 0  . ibuprofen (ADVIL,MOTRIN) 200 MG tablet Take 200 mg by mouth every 6 (six) hours as needed.    Marland Kitchen. oseltamivir (TAMIFLU) 75 MG capsule Take 1 capsule (75 mg total) by mouth 2 (two) times daily. 10 capsule 0  . cyclobenzaprine (FLEXERIL) 5 MG tablet Take 1 tablet (5 mg total) by mouth 3 (three) times daily as needed  for muscle spasms. 30 tablet 0  . ondansetron (ZOFRAN) 4 MG tablet Take 1 tablet (4 mg total) by mouth every 8 (eight) hours as needed for nausea or vomiting. 20 tablet 0   No current facility-administered medications for this visit.     Allergies:  Allergies  Allergen Reactions  . Penicillins     childhood  . Tessalon [Benzonatate] Dermatitis    Abdomen numb and severe dry mouth    No past surgical history on file.  Social History   Social History  . Marital status: Married    Spouse name: N/Earl  . Number of children: N/Earl  . Years of education: N/Earl   Social History Main Topics  . Smoking status: Never Smoker  . Smokeless tobacco: Never Used  . Alcohol use No  . Drug use: No  . Sexual activity: Yes    Birth control/ protection: None   Other Topics Concern  . None   Social History Narrative  . None    ROS See hpi   Objective: Vitals:   12/18/16 1436  BP: 110/80  Pulse: 71  Resp: 17  Temp: 98.1 F (36.7 C)  TempSrc: Oral  SpO2: 99%  Weight: 148 lb 8 oz (67.4 kg)  Height: 5\' 3"  (1.6 m)    Physical Exam General: alert, oriented,  in NAD Head: normocephalic, atraumatic, no sinus tenderness Eyes: EOM intact, no scleral icterus or conjunctival injection Ears: TM clear bilaterally Throat: no pharyngeal exudate or erythema Lymph: no posterior auricular, submental or cervical lymph adenopathy Heart: normal rate, normal sinus rhythm, no murmurs Lungs: clear to auscultation bilaterally, no wheezing    Assessment and Plan Earl Meyer was seen today for nausea, back pain and headache.  Diagnoses and all orders for this visit:  Influenza Myalgia  continue supportive care Rest and hydration zofran and flexeril prn  -     ondansetron (ZOFRAN) 4 MG tablet; Take 1 tablet (4 mg total) by mouth every 8 (eight) hours as needed for nausea or vomiting. -     cyclobenzaprine (FLEXERIL) 5 MG tablet; Take 1 tablet (5 mg total) by mouth 3 (three) times daily as needed  for muscle spasms.     Earl Meyer Earl Meyer

## 2016-12-18 NOTE — Patient Instructions (Addendum)
Gripe en los adultos °(Influenza, Adult) °La gripe es una infección viral que afecta principalmente las vías respiratorias. Las vías respiratorias incluyen órganos que ayudan a respirar, como los pulmones, la nariz y la garganta. La gripe provoca muchos síntomas del resfrío común, así como fiebre alta y dolor corporal. °Se transmite fácilmente de persona a persona (es contagiosa). La mejor manera de prevenir la gripe es aplicándose la vacuna contra la gripe todos los años. °CAUSAS °La gripe es causada por un virus. Se puede contagiar el virus de las siguientes maneras: °· Al aspirar las gotitas que una persona infectada elimina al toser o estornudar. °· Al tocar algo que fue recientemente contaminado con el virus y luego llevarse la mano a la boca, la nariz o los ojos. °FACTORES DE RIESGO °Los siguientes factores pueden hacer que usted sea propenso a contraer la gripe: °· No lavarse las manos frecuentemente con agua y jabón o un desinfectante de manos a base de alcohol. °· Tener contacto cercano con muchas personas durante la temporada de resfrío y gripe. °· Tocarse la boca, los ojos o la nariz sin lavarse ni desinfectarse las manos primero. °· No beber suficientes líquidos o no seguir una dieta saludable. °· No dormir lo suficiente o no hacer suficiente actividad física. °· Tener un alto grado de estrés. °· No colocarse la vacuna anual contra la gripe. °Puede correr un mayor riesgo de complicaciones de la gripe, como una infección pulmonar grave (neumonía) si: °· Tiene más de 65 años. °· Está embarazada. °· Tiene un sistema que combate las defensas (sistema inmunitario) debilitado. Puede tener un sistema inmunitario debilitado si: °? Tiene VIH o sida. °? Está recibiendo quimioterapia. °? Usa medicamentos que reducen la actividad (suprimen) del sistema inmunitario. °· Tiene una enfermedad a largo plazo (crónica), como cardiopatía coronaria, enfermedad renal, diabetes o enfermedad pulmonar. °· Tiene un trastorno  hepático. °· Son obesas. °· Tiene anemia. °SÍNTOMAS °Los síntomas de esta afección por lo general duran entre 4 y 10 días, y pueden tener los siguientes síntomas: °· Fiebre. °· Escalofríos. °· Dolor de cabeza, dolores en el cuerpo o dolores musculares. °· Dolor de garganta. °· Tos. °· Secreción o congestión nasal. °· Molestias en el pecho y tos. °· Pérdida del apetito. °· Debilidad o cansancio (fatiga). °· Mareos. °· Náuseas o vómitos. °DIAGNÓSTICO °Esta afección se puede diagnosticar en función de los antecedentes médicos y un examen físico. El médico puede indicarle un cultivo faríngeo o nasal para confirmar el diagnóstico. °TRATAMIENTO °Si la gripe se detecta de manera temprana, puede recibir tratamiento con medicamentos antivirales que pueden reducir la duración de la enfermedad y la gravedad de los síntomas. Este medicamento se puede administrar por vía oral o por vía intravenosa (IV), es decir, a través de un tubo que se coloca en una vena. °El objetivo del tratamiento es aliviar los síntomas cuidándose en su casa. Esto puede incluir usar medicamentos de venta libre, beber una gran cantidad de líquidos y agregar humedad al aire en su casa. °En algunos casos, la gripe se cura sin tratamiento. Los casos graves o las complicaciones de gripe se pueden tratar en un hospital. °INSTRUCCIONES PARA EL CUIDADO EN EL HOGAR °· Tome los medicamentos de venta libre y los recetados solamente como se lo haya indicado el médico. °· Use un humidificador de aire frío para agregar humedad al aire de su casa. Esto puede ayudarlo a respirar mejor. °· Descanse todo lo que sea necesario. °· Beba suficiente líquido para mantener la orina clara o de color   amarillo pálido. °· Al toser o estornudar, cúbrase la boca y la nariz. °· Lávese las manos con agua y jabón frecuentemente, en especial después de toser o estornudar. Use desinfectante para manos si no dispone de agua y jabón. °· Quédese en su casa y no concurra al trabajo o a la  escuela, como se lo haya indicado el médico. A menos que visite al médico, evite salir de su casa hasta que no tenga fiebre durante 24 horas sin el uso de medicamentos. °· Concurra a todas las visitas de control como se lo haya indicado el médico. Esto es importante. ° °PREVENCIÓN °· Aplicarse la vacuna anual contra la gripe es la mejor manera de evitar contagiarse la gripe. Puede aplicarse la vacuna contra la gripe a fines de verano, en otoño o en invierno. Pregúntele al médico cuándo debe aplicarse la vacuna contra la gripe. °· Lávese las manos o use un desinfectante de manos con frecuencia. °· Evite el contacto con personas que están enfermas durante la temporada de resfrío y gripe. °· Siga una dieta saludable, beba muchos líquidos, duerma lo suficiente y realice actividad física con regularidad. ° °SOLICITE ATENCIÓN MÉDICA SI: °· Presenta nuevos síntomas. °· Tiene los siguientes síntomas: °? Dolor en el pecho. °? Diarrea. °? Fiebre. °· La tos empeora. °· Produce más mucosidad. °· Siente náuseas o vomita. ° °SOLICITE ATENCIÓN MÉDICA DE INMEDIATO SI: °· Le falta el aire o tiene dificultad para respirar. °· La piel o las uñas se tornan de un color azulado. °· Presenta dolor intenso o rigidez de cuello. °· Le duele la cabeza de forma repentina o tiene dolor en la cara o el oído. °· No puede dejar de vomitar. ° °Esta información no tiene como fin reemplazar el consejo del médico. Asegúrese de hacerle al médico cualquier pregunta que tenga. °Document Released: 09/01/2005 Document Revised: 03/15/2016 Document Reviewed: 09/16/2015 °Elsevier Interactive Patient Education © 2017 Elsevier Inc. ° °

## 2017-03-08 ENCOUNTER — Ambulatory Visit (INDEPENDENT_AMBULATORY_CARE_PROVIDER_SITE_OTHER): Payer: BLUE CROSS/BLUE SHIELD | Admitting: Urgent Care

## 2017-03-08 VITALS — BP 119/73 | HR 66 | Temp 98.4°F | Ht 63.0 in | Wt 146.8 lb

## 2017-03-08 DIAGNOSIS — R05 Cough: Secondary | ICD-10-CM

## 2017-03-08 DIAGNOSIS — R0982 Postnasal drip: Secondary | ICD-10-CM

## 2017-03-08 DIAGNOSIS — J302 Other seasonal allergic rhinitis: Secondary | ICD-10-CM

## 2017-03-08 DIAGNOSIS — H938X3 Other specified disorders of ear, bilateral: Secondary | ICD-10-CM | POA: Diagnosis not present

## 2017-03-08 DIAGNOSIS — L299 Pruritus, unspecified: Secondary | ICD-10-CM | POA: Diagnosis not present

## 2017-03-08 DIAGNOSIS — R059 Cough, unspecified: Secondary | ICD-10-CM

## 2017-03-08 MED ORDER — CETIRIZINE HCL 10 MG PO TABS
10.0000 mg | ORAL_TABLET | Freq: Every day | ORAL | 11 refills | Status: DC
Start: 2017-03-08 — End: 2018-02-24

## 2017-03-08 MED ORDER — HYDROCODONE-HOMATROPINE 5-1.5 MG/5ML PO SYRP: 5.0000 mL | ORAL_SOLUTION | Freq: Four times a day (QID) | ORAL | 0 refills | Status: DC | PRN

## 2017-03-08 MED ORDER — METHYLPREDNISOLONE ACETATE 80 MG/ML IJ SUSP
80.0000 mg | Freq: Once | INTRAMUSCULAR | Status: AC
  Administered 2017-03-08: 80 mg via INTRAMUSCULAR

## 2017-03-08 MED ORDER — PSEUDOEPHEDRINE HCL ER 120 MG PO TB12: 120.0000 mg | ORAL_TABLET | Freq: Two times a day (BID) | ORAL | 3 refills | Status: DC

## 2017-03-08 NOTE — Progress Notes (Signed)
  MRN: [id number] DOB: 1975-12-08  Subjective:   Earl Meyer is a 41 y.o. male presenting for chief complaint of Cough (X 3 weeks with sore throat) and Facial Pain  Reports 3 week history of dry cough, scratchy throat, nasal congestion, ear fullness. Denies fever, sinus pain, ear pain, sore throat, chest pain, shob, wheezing, n/v, abdominal pain. Has been using benadryl. Admits history of allergies but is not taking any of his allergy medications. Denies history of asthma. Denies smoking cigarettes.  Earl Meyer has a current medication list which includes the following prescription(s): acetaminophen, chlorpheniramine-hydrocodone, cyclobenzaprine, fluticasone, hydrocodone-homatropine, ibuprofen, and ondansetron. Also is allergic to penicillins and tessalon [benzonatate].  Earl Meyer  has a past medical history of Allergy. Also  has no past surgical history on file.  Objective:   Vitals: BP 119/73 (BP Location: Right Arm, Patient Position: Sitting, Cuff Size: Small)   Pulse 66   Temp 98.4 F (36.9 C) (Oral)   Ht  (1.6 m)   Wt 146 lb 12.8 oz (66.6 kg)   SpO2 97%   BMI 26.00 kg/m   Physical Exam  Constitutional: He is oriented to person, place, and time. He appears well-developed and well-nourished.  HENT:  TM's flat but intact bilaterally, no effusions or erythema. Nasal turbinates boggy and edematous, nasal passages patent. No sinus tenderness. Oropharynx with thick streaks of white post-nasal drainage, mucous membranes moist, dentition in good repair.  Eyes: Right eye exhibits no discharge. Left eye exhibits no discharge.  Neck: Normal range of motion. Neck supple.  Cardiovascular: Normal rate, regular rhythm and intact distal pulses.  Exam reveals no gallop and no friction rub.   No murmur heard. Pulmonary/Chest: No respiratory distress. He has no wheezes. He has no rales.  Lymphadenopathy:    He has no cervical adenopathy.  Neurological: He is alert and oriented  to person, place, and time.  Skin: Skin is warm and dry.    Assessment and Plan :   1. Acute seasonal allergic rhinitis due to other allergen 2. Post-nasal drainage 3. Cough 4. Sensation of fullness in both ears 5. Ear itching - IM Depo-Medrol, patient is very uncomfortable from his allergies. Emphasized need to consistently take nasal steroid and Zyrtec with Sudafed as needed. Rtc in 1 week if no improvement.  Wallis Bamberg, PA-C Primary Care at [facility] [phone] 03/08/2017  4:17 PM

## 2017-03-08 NOTE — Patient Instructions (Addendum)
Rinitis alrgica (Allergic Rhinitis) La rinitis alrgica ocurre cuando las membranas mucosas de la nariz responden a los alrgenos. Los alrgenos son las partculas que estn en el aire y que hacen que el cuerpo tenga una reaccin Counselling psychologist. Esto hace que usted libere anticuerpos alrgicos. A travs de una cadena de eventos, estos finalmente hacen que usted libere histamina en la corriente sangunea. Aunque la funcin de la histamina es proteger al organismo, es esta liberacin de histamina lo que provoca malestar, como los estornudos frecuentes, la congestin y goteo y Control and instrumentation engineer. CAUSAS La causa de la rinitis Merchandiser, retail (fiebre del heno) son los alrgenos del polen que pueden provenir del csped, los rboles y Theme park manager. La causa de la rinitis IT consultant (rinitis alrgica perenne) son los alrgenos, como los caros del polvo domstico, la caspa de las mascotas y las esporas del moho. SNTOMAS  Secrecin nasal (congestin).  Goteo y picazn nasales con estornudos y Arboriculturist. DIAGNSTICO Su mdico puede ayudarlo a Warehouse manager alrgeno o los alrgenos que desencadenan sus sntomas. Si usted y su mdico no pueden Chief Strategy Officer cul es el alrgeno, pueden hacerse anlisis de sangre o estudios de la piel. El mdico diagnosticar la afeccin despus de hacerle una historia clnica y un examen fsico. Adems, puede evaluarlo para detectar la presencia de otras enfermedades afines, como asma, conjuntivitis u otitis. TRATAMIENTO La rinitis alrgica no tiene [place], pero puede controlarse con lo siguiente:  Medicamentos que [employer] sntomas de [place], por ejemplo, vacunas contra la [place], aerosoles nasales y antihistamnicos por va oral.  Evitar el alrgeno. La fiebre del heno a menudo puede tratarse con antihistamnicos en las formas de pldoras o aerosol nasal. Los antihistamnicos bloquean los efectos de la histamina. Existen medicamentos de venta libre que pueden ayudar con la  congestin nasal y la hinchazn alrededor de los ojos. Consulte a su mdico antes de tomar o administrarse este medicamento. Si la prevencin del alrgeno o el medicamento recetado no dan resultado, existen muchos medicamentos nuevos que su mdico puede recetarle. Pueden usarse medicamentos ms fuertes si las medidas iniciales no son efectivas. Pueden aplicarse inyecciones desensibilizantes si los medicamentos y la prevencin no funcionan. La desensibilizacin ocurre cuando un paciente recibe vacunas constantes hasta que el cuerpo se vuelve menos sensible al alrgeno. Asegrese de Medical sales representative seguimiento con su mdico si los problemas continan. INSTRUCCIONES PARA EL CUIDADO EN EL HOGAR No es posible evitar por completo los alrgenos, pero puede reducir los sntomas al tomar medidas para limitar su exposicin a ellos. Es muy til saber exactamente a qu es alrgico para que pueda evitar sus desencadenantes especficos. SOLICITE ATENCIN MDICA SI:  Earl Meyer.  Desarrolla una tos que no cesa fcilmente (persistente).  Le falta el aire.  Comienza a tener sibilancias.  Los sntomas interfieren con las actividades diarias normales. Esta informacin no tiene Theme park manager el consejo del mdico. Asegrese de hacerle al mdico cualquier pregunta que tenga. Document Released: 09/01/2005 Document Revised: 12/13/2014 Document Reviewed: 07/30/2013 Elsevier Interactive Patient Education  2017 [employer].     IF you received an x-ray today, you will receive an invoice from [facility]. Please contact [facility] at [phone] with questions or concerns regarding your invoice.   IF you received labwork today, you will receive an invoice from [place] [facility]. Please contact [facility] at [phone] with questions or concerns regarding your invoice.   Our billing staff will not be able to assist you with questions regarding bills from these companies.  You will be  contacted with  the lab results as soon as they are available. The fastest way to get your results is to activate your My Chart account. Instructions are located on the last page of this paperwork. If you have not heard from [place] regarding the results in 2 weeks, please contact this office.

## 2017-03-12 ENCOUNTER — Ambulatory Visit (INDEPENDENT_AMBULATORY_CARE_PROVIDER_SITE_OTHER): Payer: BLUE CROSS/BLUE SHIELD | Admitting: Urgent Care

## 2017-03-12 VITALS — BP 112/76 | HR 59 | Temp 98.4°F | Resp 18 | Ht 63.39 in | Wt 144.8 lb

## 2017-03-12 DIAGNOSIS — J302 Other seasonal allergic rhinitis: Secondary | ICD-10-CM

## 2017-03-12 DIAGNOSIS — Z0000 Encounter for general adult medical examination without abnormal findings: Secondary | ICD-10-CM

## 2017-03-12 NOTE — Progress Notes (Signed)
MRN: [id number] DOB: [birth year]  Subjective:   Earl Meyer is a 41 y.o. male presenting for an Annual Exam  Patient is married, has 2 children. Has good relationships at home, has a good support network. 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 Tries to eat healthily, does not hydrate well due to demands of his job. Denies smoking cigarettes. Stopped drinking alcohol in 2003.   PCP: Wallis Bamberg Vision: No visual deficits. Dental: Gets cleanings once yearly, plans on going back soon. Specialists: None.   Health Maintenance: Influenza updated 09/05/2016, tdap updated 2017. Has had HIV testing in 2014, was negative.  Earl Meyer has a current medication list which includes the following prescription(s): acetaminophen, cetirizine, chlorpheniramine-hydrocodone, cyclobenzaprine, fluticasone, hydrocodone-homatropine, ibuprofen, ondansetron, and pseudoephedrine. Also is allergic to penicillins and tessalon [benzonatate]. Earl Meyer  has a past medical history of Allergy. Also denies past surgical history. His family history includes Cancer in his father.   Review of Systems  Constitutional: Negative for chills, diaphoresis, fever, malaise/fatigue and weight loss.  HENT: Negative for congestion, ear discharge, ear pain, hearing loss, nosebleeds, sore throat and tinnitus.   Eyes: Negative for blurred vision, double vision, photophobia, pain, discharge and redness.  Respiratory: Negative for cough, shortness of breath and wheezing.   Cardiovascular: Negative for chest pain, palpitations and leg swelling.  Gastrointestinal: Negative for abdominal pain, blood in stool, constipation, diarrhea, nausea and vomiting.  Genitourinary: Negative for dysuria, flank pain, frequency, hematuria and urgency.  Musculoskeletal: Negative for back pain, joint pain and myalgias.  Skin: Negative for itching and rash.  Neurological: Negative for dizziness, tingling, seizures, loss of  consciousness, weakness and headaches.  Endo/Heme/Allergies: Negative for polydipsia.  Psychiatric/Behavioral: Negative for depression, hallucinations, memory loss, substance abuse and suicidal ideas. The patient is not nervous/anxious and does not have insomnia.    Objective:   Vitals: BP 112/76   Pulse (!) 59   Temp 98.4 F (36.9 C) (Oral)   Resp 18   Ht 5' 3.39" (1.61 m)   Wt 144 lb 12.8 oz (65.7 kg)   SpO2 96%   BMI 25.34 kg/m    Visual Acuity Screening   Right eye Left eye Both eyes  Without correction: 20/13 20/13-1 20/13  With correction:      Physical Exam  Constitutional: He is oriented to person, place, and time. He appears well-developed and well-nourished.  HENT:  TM's intact bilaterally, no effusions or erythema. Nasal turbinates pink and moist, nasal passages patent. No sinus tenderness. Oropharynx clear, mucous membranes moist, dentition in good repair.  Eyes: Conjunctivae and EOM are normal. Pupils are equal, round, and reactive to light. Right eye exhibits no discharge. Left eye exhibits no discharge. No scleral icterus.  Neck: Normal range of motion. Neck supple. No thyromegaly present.  Cardiovascular: Normal rate, regular rhythm and intact distal pulses.  Exam reveals no gallop and no friction rub.   No murmur heard. Pulmonary/Chest: No stridor. No respiratory distress. He has no wheezes. He has no rales.  Abdominal: Soft. Bowel sounds are normal. He exhibits no distension and no mass. There is no tenderness.  Musculoskeletal: Normal range of motion. He exhibits no edema or tenderness.  Lymphadenopathy:    He has no cervical adenopathy.  Neurological: He is alert and oriented to person, place, and time. He has normal reflexes.  Skin: Skin is warm and dry. No rash noted. No erythema. No pallor.  Psychiatric: He has a normal mood and affect.   Assessment and  Plan :   1. Annual physical exam - Medically stable, labs pending. Patient is a very pleasant  man. Discussed need for him to take better care of himself during work, make sure to hydrate adequately, eat regular meals. Discussed healthy lifestyle, diet, exercise, preventative care, vaccinations, and addressed patient's concerns.  - Comprehensive metabolic panel - Lipid panel - TSH - CBC  2. Acute seasonal allergic rhinitis due to other allergen - Significantly improved. Patient will continue to use allergy medications. F/u as needed.  Wallis Bamberg, PA-C Primary Care at [facility] [phone] 03/12/2017  8:16 AM

## 2017-03-12 NOTE — Patient Instructions (Addendum)

## 2017-03-13 LAB — LIPID PANEL
CHOL/HDL RATIO: 2.9 ratio (ref 0.0–5.0)
CHOLESTEROL TOTAL: 223 mg/dL — AB (ref 100–199)
HDL: 77 mg/dL (ref >39)
LDL CALC: 128 mg/dL — AB (ref 0–99)
Triglycerides: 92 mg/dL (ref 0–149)
VLDL CHOLESTEROL CAL: 18 mg/dL (ref 5–40)

## 2017-03-13 LAB — COMPREHENSIVE METABOLIC PANEL
ALBUMIN: 4.2 g/dL (ref 3.5–5.5)
ALK PHOS: 59 IU/L (ref 39–117)
ALT: 42 IU/L (ref 0–44)
AST: 27 IU/L (ref 0–40)
Albumin/Globulin Ratio: 1.5 (ref 1.2–2.2)
BUN / CREAT RATIO: 17 (ref 9–20)
BUN: 14 mg/dL (ref 6–24)
Bilirubin Total: 0.6 mg/dL (ref 0.0–1.2)
CALCIUM: 9.4 mg/dL (ref 8.7–10.2)
CO2: 26 mmol/L (ref 18–29)
CREATININE: 0.81 mg/dL (ref 0.76–1.27)
Chloride: 100 mmol/L (ref 96–106)
GFR, EST AFRICAN AMERICAN: 127 mL/min/{1.73_m2} (ref >59)
GFR, EST NON AFRICAN AMERICAN: 110 mL/min/{1.73_m2} (ref >59)
GLUCOSE: 95 mg/dL (ref 65–99)
Globulin, Total: 2.8 g/dL (ref 1.5–4.5)
Potassium: 4.1 mmol/L (ref 3.5–5.2)
SODIUM: 140 mmol/L (ref 134–144)
TOTAL PROTEIN: 7 g/dL (ref 6.0–8.5)

## 2017-03-13 LAB — TSH: TSH: 2.08 u[IU]/mL (ref 0.450–4.500)

## 2017-03-13 LAB — CBC
HEMOGLOBIN: 15.8 g/dL (ref 13.0–17.7)
Hematocrit: 46.2 % (ref 37.5–51.0)
MCH: 31.6 pg (ref 26.6–33.0)
MCHC: 34.2 g/dL (ref 31.5–35.7)
MCV: 92 fL (ref 79–97)
Platelets: 314 10*3/uL (ref 150–379)
RBC: 5 x10E6/uL (ref 4.14–5.80)
RDW: 14.1 % (ref 12.3–15.4)
WBC: 7.7 10*3/uL (ref 3.4–10.8)

## 2018-02-22 ENCOUNTER — Other Ambulatory Visit: Payer: Self-pay

## 2018-02-22 ENCOUNTER — Encounter: Payer: Self-pay | Admitting: Emergency Medicine

## 2018-02-22 ENCOUNTER — Ambulatory Visit (INDEPENDENT_AMBULATORY_CARE_PROVIDER_SITE_OTHER): Payer: Self-pay | Admitting: Emergency Medicine

## 2018-02-22 VITALS — BP 105/59 | HR 81 | Temp 98.3°F | Resp 16 | Ht 63.5 in | Wt 153.0 lb

## 2018-02-22 DIAGNOSIS — J029 Acute pharyngitis, unspecified: Secondary | ICD-10-CM

## 2018-02-22 DIAGNOSIS — R0981 Nasal congestion: Secondary | ICD-10-CM

## 2018-02-22 DIAGNOSIS — J22 Unspecified acute lower respiratory infection: Secondary | ICD-10-CM

## 2018-02-22 DIAGNOSIS — R059 Cough, unspecified: Secondary | ICD-10-CM | POA: Insufficient documentation

## 2018-02-22 DIAGNOSIS — R05 Cough: Secondary | ICD-10-CM

## 2018-02-22 MED ORDER — PROMETHAZINE-CODEINE 6.25-10 MG/5ML PO SYRP: 5.0000 mL | ORAL_SOLUTION | Freq: Every evening | ORAL | 0 refills | Status: DC | PRN

## 2018-02-22 MED ORDER — AZITHROMYCIN 250 MG PO TABS: ORAL_TABLET | ORAL | 0 refills | Status: DC

## 2018-02-22 MED ORDER — PSEUDOEPHEDRINE-GUAIFENESIN ER 60-600 MG PO TB12: 1.0000 | ORAL_TABLET | Freq: Two times a day (BID) | ORAL | 1 refills | Status: DC

## 2018-02-22 NOTE — Addendum Note (Signed)
Addended by: Evie LacksSAGARDIA, Stanly Si J on: 02/22/2018 11:24 AM   Modules accepted: Level of Service

## 2018-02-22 NOTE — Patient Instructions (Addendum)
     IF you received an x-ray today, you will receive an invoice from [facility]. Please contact [facility] at [phone] with questions or concerns regarding your invoice.   IF you received labwork today, you will receive an invoice from [facility]. Please contact [facility] at [phone] with questions or concerns regarding your invoice.   Our billing staff will not be able to assist you with questions regarding bills from these companies.  You will be contacted with the lab results as soon as they are available. The fastest way to get your results is to activate your My Chart account. Instructions are located on the last page of this paperwork. If you have not heard from us regarding the results in 2 weeks, please contact this office.     Cough, Adult A cough helps to clear your throat and lungs. A cough may last only 2-3 weeks (acute), or it may last longer than 8 weeks (chronic). Many different things can cause a cough. A cough may be a sign of an illness or another medical condition. Follow these instructions at home:  Pay attention to any changes in your cough.  Take medicines only as told by your doctor. ? If you were prescribed an antibiotic medicine, take it as told by your doctor. Do not stop taking it even if you start to feel better. ? Talk with your doctor before you try using a cough medicine.  Drink enough fluid to keep your pee (urine) clear or pale yellow.  If the air is dry, use a cold steam vaporizer or humidifier in your home.  Stay away from things that make you cough at work or at home.  If your cough is worse at night, try using extra pillows to raise your head up higher while you sleep.  Do not smoke, and try not to be around smoke. If you need help quitting, ask your doctor.  Do not have caffeine.  Do not drink alcohol.  Rest as needed. Contact a doctor if:  You have new problems (symptoms).  You cough up yellow fluid  (pus).  Your cough does not get better after 2-3 weeks, or your cough gets worse.  Medicine does not help your cough and you are not sleeping well.  You have pain that gets worse or pain that is not helped with medicine.  You have a fever.  You are losing weight and you do not know why.  You have night sweats. Get help right away if:  You cough up blood.  You have trouble breathing.  Your heartbeat is very fast. This information is not intended to replace advice given to you by your health care provider. Make sure you discuss any questions you have with your health care provider. Document Released: 08/05/2011 Document Revised: 04/29/2016 Document Reviewed: 01/29/2015 Elsevier Interactive Patient Education  2018 Elsevier Inc.  

## 2018-02-22 NOTE — Progress Notes (Signed)
Earl Meyer 42 y.o.   Chief Complaint  Patient presents with  . Ear Pain    both x 3 days  . Sore Throat    HISTORY OF PRESENT ILLNESS: This is a 42 y.o. male complaining of sore throat, bilateral earaches, cough, itching in the back, warm eyes, and chills that started 3-4 days ago.  Works in a hospital.  No other significant symptoms.  Sore Throat   This is a new problem. The current episode started in the past 7 days. The problem has been gradually worsening. There has been no fever. The pain is at a severity of 3/10. The pain is mild. Associated symptoms include congestion, coughing and ear pain. Pertinent negatives include no abdominal pain, diarrhea, ear discharge, headaches, neck pain, shortness of breath, swollen glands, trouble swallowing or vomiting. He has had no exposure to strep or mono. He has tried nothing for the symptoms.     Prior to Admission medications   Medication Sig Start Date End Date Taking? Authorizing Provider  cetirizine (ZYRTEC) 10 MG tablet Take 1 tablet (10 mg total) by mouth daily. 03/08/17  Yes Wallis Bamberg, PA-C  cyclobenzaprine (FLEXERIL) 5 MG tablet Take 1 tablet (5 mg total) by mouth 3 (three) times daily as needed for muscle spasms. 12/18/16  Yes Stallings, Zoe A, MD  HYDROcodone-homatropine (HYCODAN) 5-1.5 MG/5ML syrup Take 5 mLs by mouth every 6 (six) hours as needed. 03/08/17  Yes Wallis Bamberg, PA-C  ibuprofen (ADVIL,MOTRIN) 200 MG tablet Take 200 mg by mouth every 6 (six) hours as needed.   Yes [provider]  acetaminophen (TYLENOL) 325 MG tablet Take 650 mg by mouth every 6 (six) hours as needed.    [provider]  chlorpheniramine-HYDROcodone (TUSSIONEX PENNKINETIC ER) 10-8 MG/5ML SUER Take 5 mLs by mouth every 12 (twelve) hours as needed for cough. Patient not taking: Reported on 02/22/2018 11/04/16   McVey, Madelaine Bhat, PA-C  fluticasone [facility]) 50 MCG/ACT nasal spray Place 2 sprays into both nostrils  daily. Patient not taking: Reported on 02/22/2018 11/04/16   McVey, Madelaine Bhat, PA-C  ondansetron (ZOFRAN) 4 MG tablet Take 1 tablet (4 mg total) by mouth every 8 (eight) hours as needed for nausea or vomiting. Patient not taking: Reported on 02/22/2018 12/18/16   Doristine Bosworth, MD  pseudoephedrine (SUDAFED 12 HOUR) 120 MG 12 hr tablet Take 1 tablet (120 mg total) by mouth 2 (two) times daily. Patient not taking: Reported on 02/22/2018 03/08/17   Wallis Bamberg, PA-C    Allergies  Allergen Reactions  . Penicillins     childhood  . Tessalon [Benzonatate] Dermatitis    Abdomen numb and severe dry mouth    There are no active problems to display for this patient.   Past Medical History:  Diagnosis Date  . Allergy     No past surgical history on file.  Social History   Socioeconomic History  . Marital status: Married    Spouse name: Not on file  . Number of children: Not on file  . Years of education: Not on file  . Highest education level: Not on file  Social Needs  . Financial resource strain: Not on file  . Food insecurity - worry: Not on file  . Food insecurity - inability: Not on file  . Transportation needs - medical: Not on file  . Transportation needs - non-medical: Not on file  Occupational History  . Not on file  Tobacco Use  . Smoking status: Never Smoker  .  Smokeless tobacco: Never Used  Substance and Sexual Activity  . Alcohol use: No    Alcohol/week: 0.0 oz  . Drug use: No  . Sexual activity: Yes    Birth control/protection: None  Other Topics Concern  . Not on file  Social History Narrative  . Not on file    Family History  Problem Relation Age of Onset  . Cancer Father      Review of Systems  Constitutional: Positive for chills. Negative for fever and malaise/fatigue.  HENT: Positive for congestion, ear pain and sore throat. Negative for ear discharge, nosebleeds, sinus pain and trouble swallowing.   Eyes: Negative.  Negative for blurred  vision, double vision, discharge and redness.  Respiratory: Positive for cough and sputum production. Negative for shortness of breath and wheezing.   Cardiovascular: Negative.  Negative for chest pain and palpitations.  Gastrointestinal: Negative for abdominal pain, blood in stool, diarrhea, nausea and vomiting.  Genitourinary: Negative.  Negative for dysuria and hematuria.  Musculoskeletal: Negative for back pain, joint pain, myalgias and neck pain.  Skin: Positive for itching.  Neurological: Negative.  Negative for dizziness, sensory change, focal weakness and headaches.  Endo/Heme/Allergies: Negative.   All other systems reviewed and are negative.  Vitals:   02/22/18 1056  BP: (!) 105/59  Pulse: 81  Resp: 16  Temp: 98.3 F (36.8 C)  SpO2: 97%     Physical Exam  Constitutional: He is oriented to person, place, and time. He appears well-developed and well-nourished.  HENT:  Head: Normocephalic and atraumatic.  Right Ear: Tympanic membrane, external ear and ear canal normal.  Left Ear: Tympanic membrane, external ear and ear canal normal.  Nose: Nose normal.  Mouth/Throat: Uvula is midline. No uvula swelling. Posterior oropharyngeal erythema present. No oropharyngeal exudate, posterior oropharyngeal edema or tonsillar abscesses.  Eyes: Conjunctivae and EOM are normal. Pupils are equal, round, and reactive to light.  Neck: Normal range of motion. Neck supple. No JVD present. No thyromegaly present.  Cardiovascular: Normal rate, regular rhythm and normal heart sounds.  Pulmonary/Chest: Effort normal and breath sounds normal.  Abdominal: Soft. Bowel sounds are normal. He exhibits no distension. There is no tenderness.  Musculoskeletal: Normal range of motion.  Lymphadenopathy:    He has no cervical adenopathy.  Neurological: He is alert and oriented to person, place, and time. No sensory deficit. He exhibits normal muscle tone.  Skin: Skin is warm and dry. Capillary refill takes  less than 2 seconds. No rash noted.  Psychiatric: He has a normal mood and affect. His behavior is normal.  Vitals reviewed.    ASSESSMENT & PLAN: Earl SawyersSalvador was seen today for ear pain and sore throat.  Diagnoses and all orders for this visit:  Sore throat  Lower respiratory infection -     azithromycin (ZITHROMAX) 250 MG tablet; Sig as indicated  Sinus congestion -     pseudoephedrine-guaifenesin (MUCINEX D) 60-600 MG 12 hr tablet; Take 1 tablet by mouth every 12 (twelve) hours for 5 days.  Cough -     promethazine-codeine (PHENERGAN WITH CODEINE) 6.25-10 MG/5ML syrup; Take 5 mLs by mouth at bedtime as needed for cough.   A total of 25 minutes was spent in the room with the patient, greater than 50% of which was in counseling/coordination of care.   Patient Instructions       IF you received an x-ray today, you will receive an invoice from [facility]. Please contact [facility] at [phone] with questions or concerns  regarding your invoice.   IF you received labwork today, you will receive an invoice from [facility]. Please contact [facility] at [phone] with questions or concerns regarding your invoice.   Our billing staff will not be able to assist you with questions regarding bills from these companies.  You will be contacted with the lab results as soon as they are available. The fastest way to get your results is to activate your My Chart account. Instructions are located on the last page of this paperwork. If you have not heard from [place] regarding the results in 2 weeks, please contact this office.     Cough, Adult A cough helps to clear your throat and lungs. A cough may last only 2-3 weeks (acute), or it may last longer than 8 weeks (chronic). Many different things can cause a cough. A cough may be a sign of an illness or another medical condition. Follow these instructions at home:  Pay attention to any changes in your cough.  Take  medicines only as told by your doctor. ? If you were prescribed an antibiotic medicine, take it as told by your doctor. Do not stop taking it even if you start to feel better. ? Talk with your doctor before you try using a cough medicine.  Drink enough fluid to keep your pee (urine) clear or pale yellow.  If the air is dry, use a cold steam vaporizer or humidifier in your home.  Stay away from things that make you cough at work or at home.  If your cough is worse at night, try using extra pillows to raise your head up higher while you sleep.  Do not smoke, and try not to be around smoke. If you need help quitting, ask your doctor.  Do not have caffeine.  Do not drink alcohol.  Rest as needed. Contact a doctor if:  You have new problems (symptoms).  You cough up yellow fluid (pus).  Your cough does not get better after 2-3 weeks, or your cough gets worse.  Medicine does not help your cough and you are not sleeping well.  You have pain that gets worse or pain that is not helped with medicine.  You have a fever.  You are losing weight and you do not know why.  You have night sweats. Get help right away if:  You cough up blood.  You have trouble breathing.  Your heartbeat is very fast. This information is not intended to replace advice given to you by your health care provider. Make sure you discuss any questions you have with your health care provider. Document Released: 08/05/2011 Document Revised: 04/29/2016 Document Reviewed: 01/29/2015 Elsevier Interactive Patient Education  2018 Elsevier Inc.     Edwina Barth, MD [facility]

## 2018-02-23 ENCOUNTER — Telehealth: Payer: Self-pay | Admitting: Emergency Medicine

## 2018-02-23 NOTE — Telephone Encounter (Signed)
Will route to office; LOV 02/22/18 with Dr Alvy BimlerSagardia

## 2018-02-23 NOTE — Telephone Encounter (Signed)
Copied from CRM [phone]. Topic: Quick Communication - See Telephone Encounter >> Feb 23, 2018  9:06 AM Arlyss Gandyichardson, Lucas Winograd N, NT wrote: CRM for notification. See Telephone encounter for: 02/23/18. Pt wants to know if the rx for promethazine-codeine (PHENERGAN WITH CODEINE) can be changed to every 6 hours instead of just at bedtime.

## 2018-02-24 ENCOUNTER — Ambulatory Visit: Payer: Self-pay | Admitting: Physician Assistant

## 2018-02-24 ENCOUNTER — Encounter: Payer: Self-pay | Admitting: Physician Assistant

## 2018-02-24 VITALS — BP 115/75 | HR 85 | Temp 98.3°F | Resp 17 | Ht 63.5 in | Wt 156.0 lb

## 2018-02-24 DIAGNOSIS — R05 Cough: Secondary | ICD-10-CM

## 2018-02-24 DIAGNOSIS — R059 Cough, unspecified: Secondary | ICD-10-CM

## 2018-02-24 DIAGNOSIS — J302 Other seasonal allergic rhinitis: Secondary | ICD-10-CM

## 2018-02-24 MED ORDER — HYDROCODONE-HOMATROPINE 5-1.5 MG/5ML PO SYRP: 5.0000 mL | ORAL_SOLUTION | Freq: Three times a day (TID) | ORAL | 0 refills | Status: DC | PRN

## 2018-02-24 MED ORDER — LEVOCETIRIZINE DIHYDROCHLORIDE 5 MG PO TABS: 5.0000 mg | ORAL_TABLET | Freq: Every evening | ORAL | 6 refills | Status: DC

## 2018-02-24 NOTE — Patient Instructions (Addendum)
Start taking Xyzal every morning for the next month.  Use Flonase nasal spray 1-2 times/day.  Drink at least 32 oz water daily.  Come back if you are not improving in 1-2 weeks.   To stop this from happening next year - Start taking allergy medicine (like Zyrtec or Xyzal) every year starting at the end of February. Take the medicine for 2-3 months.   Rinitis alrgica en adultos Allergic Rhinitis, Adult La rinitis alrgica es una reaccin alrgica que afecta la membrana mucosa que se encuentra en la nariz. Provoca estornudos, goteo o congestin nasal, y la sensacin de que baja mucosidad por la parte trasera de la garganta(goteo posnasal). La rinitis alrgica puede ser de leve a grave. [employer] tipos de rinitis alrgica:  Astronomer. Este tipo tambin se denomina fiebre del heno. Sucede nicamente durante algunas estaciones.  Perenne. Este tipo puede ocurrir en cualquier momento del ao.  Cules son las causas? Esta afeccin ocurre cuando el sistema de defensa del cuerpo(sistema inmunitario) reacciona a ciertas sustancias inofensivas llamadas alrgenos como si fueran grmenes.  La rinitis alrgica estacional se desencadena por el polen, que puede provenir del csped, los rboles y [place]. La rinitis alrgica perenne puede ser causada por:  Los caros del polvo en Advice worker.  La caspa de las [place].  Esporas del moho.  Cules son los signos o los sntomas? Los sntomas de esta afeccin incluyen lo siguiente:  Estornudos.  Nariz tapada o que gotea (congestin nasal).  Goteo posnasal.  Escozor en la Darene [place].  Ojos llorosos.  Dificultad para dormir.  Somnolencia Administrator.  Cmo se diagnostica? Esta afeccin se puede diagnosticar en funcin de lo siguiente:  Sus antecedentes mdicos.  Un examen fsico.  Estudios para detectar afecciones relacionadas, como las siguientes: ? Asma. ? Ojo rojo. ? Infeccin en los odos. ? Infeccin de las vas  respiratorias superiores.  Estudios para Location manager sus sntomas. Por ejemplo, anlisis de sangre y cutneos.  Cmo se trata? No hay cura para esta afeccin, pero el tratamiento puede ayudar a [employer] sntomas. El tratamiento puede incluir lo siguiente:  Tomar medicamentos que [employer] sntomas de la [place], [place] antihistamnicos. Medicamentos que pueden administrarse por inyeccin, aerosol nasal o pldoras.  Evitar el alrgeno.  Desensibilizacin. Este tratamiento implica que se le apliquen inyecciones continuas hasta que su cuerpo se vuelva menos sensible al alrgeno. Este tratamiento se puede realizar si otros tratamientos no son eficaces.  Si tomar medicamentos y Multimedia programmer alrgeno no funciona, se pueden recetar nuevos medicamentos ms fuertes.  Siga estas indicaciones en su casa:  Conozca a qu es Best boy. Los alrgenos comunes incluyen el humo, polvo y [place].  Evite las cosas a las cuales es alrgico. Hay medidas que puede tomar para ayudar a Automotive engineer los alrgenos: ? [employer] alfombras por pisos de [place], baldosas o vinilo. Las alfombras pueden retener la caspa de los animales y [place]. ? No fume. No permita que fumen en su casa. ? Cambie el filtro de la calefaccin y del aire acondicionado al menos una vez al mes. ? Durante la temporada de alergias:  Mantenga las ventanas cerradas la mayor cantidad de [place] posible.  Planee actividades al aire libre cuando las concentraciones de polen estn en su nivel ms bajo. Normalmente, esto es [employer] de noche.  Cuando ingrese al interior, [place] de ropa y dese una ducha antes de sentarse en los muebles o la cama.  [employer]  de venta libre y los recetados solamente como se lo haya indicado el mdico.  OceanographerConcurra a todas las visitas de seguimiento como se lo haya indicado el mdico. Esto es importante. Comunquese con un mdico si:  Tiene fiebre.  Tiene tos  persistente.  Comienza a emitir un sonido agudo al respirar (sibilancia).  Sus sntomas interfieren con sus actividades diarias normales. Solicite ayuda de inmediato si:  Le falta el aire. Resumen  Esta afeccin puede controlarse al tomar United Parcelmedicamentos como se le indique y al Secretary/administratorevitar los alrgenos.  Comunquese con su mdico si tiene tos persistente o fiebre.  Durante la temporada de [place]alergias, Aultmantenga las ventanas cerradas la mayor cantidad de [place] posible. Esta informacin no tiene Theme park managercomo fin reemplazar el consejo del mdico. Asegrese de hacerle al mdico cualquier pregunta que tenga. Document Released: 09/01/2005 Document Revised: 03/09/2017 Document Reviewed: 03/09/2017 Elsevier Interactive Patient Education  2018 [employer]vier Inc.   IF you received an x-ray today, you will receive an invoice from [facility]. Please contact [facility] at [phone] with questions or concerns regarding your invoice.   IF you received labwork today, you will receive an invoice from [facility]. Please contact [facility] at [phone] with questions or concerns regarding your invoice.   Our billing staff will not be able to assist you with questions regarding bills from these companies.  You will be contacted with the lab results as soon as they are available. The fastest way to get your results is to activate your My Chart account. Instructions are located on the last page of this paperwork. If you have not heard from us regarding the results in 2 weeks, please contact this office.

## 2018-02-24 NOTE — Telephone Encounter (Signed)
No.  Thanks

## 2018-02-24 NOTE — Progress Notes (Signed)
   Earl Meyer  MRN: [id number] DOB: 02/23/1976  PCP: Patient, No Pcp Per  Subjective:  Pt is a 42 year old male who presents to clinic for cough. He was here 2 days ago c/o sore throat, bilateral earaches, cough, itching in the back, warm eyes, and chills x 3 days. Rx for azithromycin, promethazine-codeine and mucinex D. He is not feeling any better.   Review of Systems  Constitutional: Negative for chills, diaphoresis, fatigue and fever.  HENT: Positive for congestion, postnasal drip, rhinorrhea and sneezing. Negative for sinus pressure, sinus pain and sore throat.   Respiratory: Positive for cough. Negative for chest tightness, shortness of breath and wheezing.   Cardiovascular: Negative for chest pain and palpitations.    Patient Active Problem List   Diagnosis Date Noted  . Sore throat 02/22/2018  . Lower respiratory infection 02/22/2018  . Sinus congestion 02/22/2018  . Cough 02/22/2018    Current Outpatient Medications on File Prior to Visit  Medication Sig Dispense Refill  . azithromycin (ZITHROMAX) 250 MG tablet Sig as indicated 6 tablet 0  . promethazine-codeine (PHENERGAN WITH CODEINE) 6.25-10 MG/5ML syrup Take 5 mLs by mouth at bedtime as needed for cough. 120 mL 0   No current facility-administered medications on file prior to visit.     Allergies  Allergen Reactions  . Penicillins     childhood  . Tessalon [Benzonatate] Dermatitis    Abdomen numb and severe dry mouth     Objective:  BP 115/75   Pulse 85   Temp 98.3 F (36.8 C) (Oral)   Resp 17   Ht 5' 3.5" (1.613 m)   Wt 156 lb (70.8 kg)   SpO2 98%   BMI 27.20 kg/m   Physical Exam  Constitutional: He is oriented to person, place, and time and well-developed, well-nourished, and in no distress. No distress.  HENT:  Right Ear: Tympanic membrane normal.  Left Ear: Tympanic membrane normal.  Nose: Mucosal edema present. No rhinorrhea. Right sinus exhibits no maxillary sinus tenderness  and no frontal sinus tenderness. Left sinus exhibits no maxillary sinus tenderness and no frontal sinus tenderness.  Mouth/Throat: Oropharynx is clear and moist and mucous membranes are normal.  Cardiovascular: Normal rate, regular rhythm and normal heart sounds.  Pulmonary/Chest: Effort normal and breath sounds normal. No respiratory distress. He has no wheezes. He has no rales.  Neurological: He is alert and oriented to person, place, and time. GCS score is 15.  Skin: Skin is warm and dry.  Psychiatric: Mood, memory, affect and judgment normal.  Vitals reviewed.   Assessment and Plan :  1. Seasonal allergic rhinitis, unspecified trigger - levocetirizine (XYZAL) 5 MG tablet; Take 1 tablet (5 mg total) by mouth every evening.  Dispense: 30 tablet; Refill: 6 - Pt presents for f/u cough. He was here 2 days ago c/o sore throat, bilateral earaches, cough, itching in the back, warm eyes, and chills x 3 days. Rx for azithromycin, promethazine-codeine and mucinex D - no improvement. Suspect symptoms are 2/2 allergies as this pt has c/o these symptoms in the past and improved after anti-histamine. Plan to start Xyzal and con't flonase at home. RTC in 7-10 days if no improvement. He understands and agrees with  2. Cough - HYDROcodone-homatropine (HYCODAN) 5-1.5 MG/5ML syrup; Take 5 mLs by mouth every 8 (eight) hours as needed for cough.  Dispense: 120 mL; Refill: 0   Whitney Thermon Zulauf, PA-C  [facility] 02/24/2018 11:09 AM

## 2018-02-27 DIAGNOSIS — J302 Other seasonal allergic rhinitis: Secondary | ICD-10-CM | POA: Insufficient documentation

## 2018-02-27 NOTE — Telephone Encounter (Signed)
Patient seen in clinic on 02/24/18. Closing note.

## 2018-03-27 ENCOUNTER — Other Ambulatory Visit: Payer: Self-pay | Admitting: Urgent Care

## 2018-12-11 ENCOUNTER — Other Ambulatory Visit: Payer: Self-pay

## 2018-12-11 ENCOUNTER — Encounter: Payer: Self-pay | Admitting: Family Medicine

## 2018-12-11 ENCOUNTER — Ambulatory Visit: Payer: Self-pay | Admitting: Family Medicine

## 2018-12-11 VITALS — BP 134/72 | HR 65 | Temp 98.5°F | Resp 18 | Ht 63.5 in | Wt 153.2 lb

## 2018-12-11 DIAGNOSIS — R059 Cough, unspecified: Secondary | ICD-10-CM

## 2018-12-11 DIAGNOSIS — R6889 Other general symptoms and signs: Secondary | ICD-10-CM

## 2018-12-11 DIAGNOSIS — J029 Acute pharyngitis, unspecified: Secondary | ICD-10-CM

## 2018-12-11 DIAGNOSIS — R05 Cough: Secondary | ICD-10-CM

## 2018-12-11 LAB — POCT RAPID STREP A (OFFICE): RAPID STREP A SCREEN: NEGATIVE

## 2018-12-11 MED ORDER — HYDROCODONE-HOMATROPINE 5-1.5 MG/5ML PO SYRP: 5.0000 mL | ORAL_SOLUTION | Freq: Three times a day (TID) | ORAL | 0 refills | Status: DC | PRN

## 2018-12-11 NOTE — Patient Instructions (Addendum)
This is a flulike illness.  You have had the flu shot, and sometimes the people who have had the flu shot will have a very mild flulike illness that is not nearly as bad as the flu (influenza ) itself.  This is not a strep sore throat, because your strept test is negative.  Drink lots of fluids and get enough rest.  Take over-the-counter DM-containing cough syrup, such as Robitussin-DM or Mucinex DM or Delsym or a generic equivalent of these.  Take acetaminophen (Tylenol) 500 mg 2 pills 3 times daily and/or ibuprofen 200 mg 3 pills 3 times daily if needed for headache, body ache, eye pain.  At nighttime you can use some of the Hycodan cough syrup (hydrocodone cough syrup) which makes you drowsy.  Do not take this in the daytime if you are going to work.  A virus infection like this just needs to run its course, and you will get well with time.  It usually lasts for about 7 to 10 days, and the cough may last longer.  Return if worse.  Especially if short of breath, increasing cough, increasing fevers, or just plain getting sicker.   Infeccin respiratoria viral Viral Respiratory Infection Una infeccin respiratoria es una enfermedad que afecta parte del sistema respiratorio, como los pulmones, la nariz o la garganta. Una infeccin respiratoria causada por un virus se llama infeccin respiratoria viral. Los tipos frecuentes de infecciones respiratorias virales incluyen lo siguiente:  Un resfro.  La gripe (influenza).  Una infeccin por el virus respiratorio sincicial (VRS). Cules son las causas? La causa de esta afeccin es un virus. Cules son los signos o sntomas? Los sntomas de esta afeccin incluyen:  Secrecin o congestin nasal.  Secrecin nasal amarilla o verde.  Tos.  Estornudos.  Fatiga.  Dolores musculares.  Dolor de Advertising copywriter.  Sudoracin o escalofros.  Grant Ruts.  Dolor de [place]. Cmo se diagnostica? Esta afeccin se puede diagnosticar en funcin de  lo siguiente:  Sus sntomas.  Un examen fsico.  Pruebas de hisopado nasal. Cmo se trata? Esta afeccin se puede tratar con medicamentos, como:  Un medicamento antiviral. Este medicamento puede acortar el tiempo en que una persona tiene los sntomas.  Expectorantes. Estos medicamentos facilitan la expulsin de la mucosidad al toser.  Aerosol nasal descongestivo.  Paracetamol o antiinflamatorios no esteroideos (AINE) para bajar la fiebre y Engineer, materials. No se recetan antibiticos para las infecciones virales. La explicacin es que los antibiticos estn diseados para matar bacterias. No son eficaces contra los virus. Siga estas indicaciones en su casa:  Control del dolor y la [employer] medicamentos de venta libre y los recetados solamente como se lo haya indicado el mdico.  Si tiene dolor de [place], haga grgaras con una mezcla de agua y sal 3 o 4veces al da, o cuando sea necesario. Para preparar la mezcla de agua y sal, disuelva totalmente de media a 1cucharadita de sal en 1taza de agua tibia.  Use gotas para la nariz elaboradas con agua salada para aliviar la congestin y Education administrator la piel irritada alrededor de la Clinical cytogeneticist.  Beba suficiente lquido como para Pharmacologist la orina de color amarillo plido. Esto ayuda a Agricultural engineer y ayuda a Risk manager mucosidad. Indicaciones generales  Descanse todo lo que pueda.  No beba alcohol.  No consuma ningn producto que contenga nicotina o tabaco, como cigarrillos y Administrator, Civil Service. Si necesita ayuda para dejar de fumar, consulte al mdico.  Concurra a todas las visitas  de control como se lo haya indicado el mdico. Esto es importante. Cmo se evita?   Colquese la vacuna anual contra la gripe. Puede colocarse la vacuna contra la gripe a fines de verano, en otoo o en invierno. Pregntele al mdico cundo debe colocarse la vacuna contra la gripe.  Evite exponer a Economist a su  infeccin respiratoria viral. ? Qudese en su casa y no concurra al Aleen Campi o a la escuela como se lo haya indicado su mdico. ? Lvese las manos con agua y jabn frecuentemente, en especial despus de toser o Engineering geologist. Use desinfectante para manos con alcohol si no dispone de [place] y [place].  Evite el contacto con personas que estn enfermas durante la temporada de resfro y gripe. Generalmente es durante el otoo y el invierno. Comunquese con un mdico si:  Los sntomas duran 10das o ms.  Los sntomas empeoran con [employer].  Tiene fiebre.  Siente un dolor intenso en los senos paranasales en el rostro o en la frente.  Las glndulas en la mandbula o el cuello estn muy hinchadas. Solicite ayuda inmediatamente si:  Hydrologist u opresin Doctor, general practice.  Le falta el aire.  Se desmaya o siente como si fuera a desmayarse.  Tienevmitos intensos y persistentes.  Sesiente desorientado o confundido. Resumen  [place] infeccin respiratoria es una enfermedad que afecta parte del sistema respiratorio, como los pulmones, la nariz o la garganta. Una infeccin respiratoria causada por un virus se llama infeccin respiratoria viral.  Los tipos frecuentes de infecciones respiratorias virales son los resfriados, la gripe y el virus respiratorio sincicial (VRS).  Los sntomas de esta afeccin incluyen congestin o goteo nasal, tos, estornudos, fatiga, dolores musculares, dolor de garganta y [place] o escalofros.  No se recetan antibiticos para las infecciones virales. La explicacin es que los antibiticos estn diseados para matar bacterias. No son eficaces contra los virus. Esta informacin no tiene Theme park manager el consejo del mdico. Asegrese de hacerle al mdico cualquier pregunta que tenga. Document Released: 09/01/2005 Document Revised: 03/06/2018 Document Reviewed: 03/06/2018 Elsevier Interactive Patient Education  [employer].      If you have lab work done  today you will be contacted with your lab results within the next 2 weeks.  If you have not heard from [place] then please contact us. The fastest way to get your results is to register for My Chart.   IF you received an x-ray today, you will receive an invoice from [facility] Radiology. Please contact [facility] Radiology at [phone] with questions or concerns regarding your invoice.   IF you received labwork today, you will receive an invoice from [place]. Please contact [facility] at [phone] with questions or concerns regarding your invoice.   Our billing staff will not be able to assist you with questions regarding bills from these companies.  You will be contacted with the lab results as soon as they are available. The fastest way to get your results is to activate your My Chart account. Instructions are located on the last page of this paperwork. If you have not heard from [place] regarding the results in 2 weeks, please contact this office.

## 2018-12-11 NOTE — Progress Notes (Signed)
Patient ID: Earl Meyer, male    DOB: 11/22/1976  Age: 43 y.o. MRN: [id number]  Chief Complaint  Patient presents with  . Cough    when sleeping is worse  . Sore Throat    started saturday night   . 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  He comes in with a 3-day history of sore throat cough and headache.  He had the flu shot back in October.  He has not been running a fever.  He has some headache and eye pain and generalized malaise.  He has body aches.  Also has pain in his back.  Current allergies, medications, problem list, past/family and social histories reviewed.  Objective:  BP 134/72   Pulse 65   Temp 98.5 F (36.9 C) (Oral)   Resp 18   Ht 5' 3.5" (1.613 m)   Wt 153 lb 3.2 oz (69.5 kg)   SpO2 98%   BMI 26.71 kg/m   No major distress.  TMs normal.  Eyes not injected.  Throat was clear.  Sinuses nontender.  Neck supple without significant nodes.  Chest is clear to auscultation.  Heart rate without murmurs.  Assessment & Plan:   Assessment: 1. Sore throat   2. Cough       Plan: See instructions  Orders Placed This Encounter  Procedures  . POCT rapid strep A    No orders of the defined types were placed in this encounter.        Patient Instructions    This is a flulike illness.  You have had the flu shot, and sometimes the people who have had the flu shot will have a very mild flulike illness that is not nearly as bad as the flu (influenza ) itself.  Drink lots of fluids and get enough rest.  Take over-the-counter DM-containing cough syrup, such as Robitussin-DM or Mucinex DM or Delsym or a generic equivalent of these.  Take acetaminophen (Tylenol) 500 mg 2 pills 3 times daily and/or ibuprofen 200 mg 3 pills 3 times daily if needed for headache, body ache, eye pain.  At nighttime you can use some of the Hycodan cough syrup (hydrocodone cough syrup) which makes you drowsy.  Do not take this in  the daytime if you are going to work.  A virus infection like this just needs to run its course, and you will get well with time.  It usually lasts for about 7 to 10 days, and the cough may last longer.  Return if worse.  Especially if short of breath, increasing cough, increasing fevers, or just plain getting sicker.   Infeccin respiratoria viral Viral Respiratory Infection Una infeccin respiratoria es una enfermedad que afecta parte del sistema respiratorio, como los pulmones, la nariz o la garganta. Una infeccin respiratoria causada por un virus se llama infeccin respiratoria viral. Los tipos frecuentes de infecciones respiratorias virales incluyen lo siguiente:  Un resfro.  La gripe (influenza).  Una infeccin por el virus respiratorio sincicial (VRS). Cules son las causas? La causa de esta afeccin es un virus. Cules son los signos o sntomas? Los sntomas de esta afeccin incluyen:  Secrecin o congestin nasal.  Secrecin nasal amarilla o verde.  Tos.  Estornudos.  Fatiga.  Dolores musculares.  Dolor de Advertising copywritergarganta.  Sudoracin o escalofros.  Grant RutsFiebre.  Dolor de [place]. Cmo se diagnostica? Esta afeccin se puede diagnosticar en funcin de lo siguiente:  Sus sntomas.  Un examen fsico.  Pruebas de hisopado nasal. Cmo se trata? Esta afeccin se puede tratar con medicamentos, como:  Un medicamento antiviral. Este medicamento puede acortar el tiempo en que una persona tiene los sntomas.  Expectorantes. Estos medicamentos facilitan la expulsin de la mucosidad al toser.  Aerosol nasal descongestivo.  Paracetamol o antiinflamatorios no esteroideos (AINE) para bajar la fiebre y Engineer, materials. No se recetan antibiticos para las infecciones virales. La explicacin es que los antibiticos estn diseados para matar bacterias. No son eficaces contra los virus. Siga estas indicaciones en su casa:  Control del dolor y la [employer]  medicamentos de venta libre y los recetados solamente como se lo haya indicado el mdico.  Si tiene dolor de [place], haga grgaras con una mezcla de agua y sal 3 o 4veces al da, o cuando sea necesario. Para preparar la mezcla de agua y sal, disuelva totalmente de media a 1cucharadita de sal en 1taza de agua tibia.  Use gotas para la nariz elaboradas con agua salada para aliviar la congestin y Education administrator la piel irritada alrededor de la Clinical cytogeneticist.  Beba suficiente lquido como para Pharmacologist la orina de color amarillo plido. Esto ayuda a Agricultural engineer y ayuda a Risk manager mucosidad. Indicaciones generales  Descanse todo lo que pueda.  No beba alcohol.  No consuma ningn producto que contenga nicotina o tabaco, como cigarrillos y Administrator, Civil Service. Si necesita ayuda para dejar de fumar, consulte al mdico.  Concurra a todas las visitas de control como se lo haya indicado el mdico. Esto es importante. Cmo se evita?   Colquese la vacuna anual contra la gripe. Puede colocarse la vacuna contra la gripe a fines de verano, en otoo o en invierno. Pregntele al mdico cundo debe colocarse la vacuna contra la gripe.  Evite exponer a Economist a su infeccin respiratoria viral. ? Qudese en su casa y no concurra al Aleen Campi o a la escuela como se lo haya indicado su mdico. ? Lvese las manos con agua y jabn frecuentemente, en especial despus de toser o Engineering geologist. Use desinfectante para manos con alcohol si no dispone de [place] y [place].  Evite el contacto con personas que estn enfermas durante la temporada de resfro y gripe. Generalmente es durante el otoo y el invierno. Comunquese con un mdico si:  Los sntomas duran 10das o ms.  Los sntomas empeoran con [employer].  Tiene fiebre.  Siente un dolor intenso en los senos paranasales en el rostro o en la frente.  Las glndulas en la mandbula o el cuello estn muy hinchadas. Solicite ayuda inmediatamente  si:  Hydrologist u opresin Doctor, general practice.  Le falta el aire.  Se desmaya o siente como si fuera a desmayarse.  Tienevmitos intensos y persistentes.  Sesiente desorientado o confundido. Resumen  [place] infeccin respiratoria es una enfermedad que afecta parte del sistema respiratorio, como los pulmones, la nariz o la garganta. Una infeccin respiratoria causada por un virus se llama infeccin respiratoria viral.  Los tipos frecuentes de infecciones respiratorias virales son los resfriados, la gripe y el virus respiratorio sincicial (VRS).  Los sntomas de esta afeccin incluyen congestin o goteo nasal, tos, estornudos, fatiga, dolores musculares, dolor de garganta y [place] o escalofros.  No se recetan antibiticos para las infecciones virales. La explicacin es que los antibiticos estn diseados para matar bacterias. No son eficaces contra los virus. Esta informacin no tiene Theme park manager el consejo del mdico. Asegrese de hacerle al mdico cualquier pregunta que  tenga. Document Released: 09/01/2005 Document Revised: 03/06/2018 Document Reviewed: 03/06/2018 Elsevier Interactive Patient Education  [employer].      If you have lab work done today you will be contacted with your lab results within the next 2 weeks.  If you have not heard from [place] then please contact us. The fastest way to get your results is to register for My Chart.   IF you received an x-ray today, you will receive an invoice from [facility] Radiology. Please contact [facility] Radiology at [phone] with questions or concerns regarding your invoice.   IF you received labwork today, you will receive an invoice from [place]. Please contact [facility] at [phone] with questions or concerns regarding your invoice.   Our billing staff will not be able to assist you with questions regarding bills from these companies.  You will be contacted with the lab results as soon as they are available. The  fastest way to get your results is to activate your My Chart account. Instructions are located on the last page of this paperwork. If you have not heard from [place] regarding the results in 2 weeks, please contact this office.        No follow-ups on file.   Janace Hoard, MD 12/11/2018

## 2020-09-19 ENCOUNTER — Other Ambulatory Visit: Payer: Self-pay

## 2020-09-19 ENCOUNTER — Ambulatory Visit
Admission: RE | Admit: 2020-09-19 | Discharge: 2020-09-19 | Disposition: A | Discharge status: Home / Self Care | Payer: Self-pay | Source: Ambulatory Visit | Attending: Physician Assistant | Admitting: Physician Assistant

## 2020-09-19 VITALS — BP 114/69 | HR 90 | Temp 98.4°F | Resp 18

## 2020-09-19 DIAGNOSIS — R52 Pain, unspecified: Secondary | ICD-10-CM

## 2020-09-19 DIAGNOSIS — R0982 Postnasal drip: Secondary | ICD-10-CM

## 2020-09-19 DIAGNOSIS — Z20822 Contact with and (suspected) exposure to covid-19: Secondary | ICD-10-CM

## 2020-09-19 DIAGNOSIS — J392 Other diseases of pharynx: Secondary | ICD-10-CM

## 2020-09-19 DIAGNOSIS — R059 Cough, unspecified: Secondary | ICD-10-CM

## 2020-09-19 MED ORDER — CETIRIZINE-PSEUDOEPHEDRINE ER 5-120 MG PO TB12: 1.0000 | ORAL_TABLET | Freq: Every day | ORAL | 0 refills | Status: AC

## 2020-09-19 NOTE — ED Triage Notes (Signed)
Pt c/o sore throat, cough, muscle aches, pressure to both ears, low grade temp, and fatigue since yesterday. Pt states got his flu shot on Monday.

## 2020-09-19 NOTE — ED Provider Notes (Signed)
[facility]    CSN: [id number] Arrival date & time: 09/19/20  1031      History   Chief Complaint Chief Complaint  Patient presents with  . appt 11-sore throat    HPI Earl Meyer is a 44 y.o. male.   44 year old male comes in for 2 day of URI symptoms. Sore throat, cough, body aches, ear pressure, fatigue, rhinorrhea, sneezing. tmax 99. Denies abdominal pain, nausea, vomiting, diarrhea. Denies shortness of breath, loss of taste/smell. Had flu shot 4 days ago. Never smoker.      Past Medical History:  Diagnosis Date  . Allergy     Patient Active Problem List   Diagnosis Date Noted  . Seasonal allergic rhinitis 02/27/2018  . Cough 02/22/2018    History reviewed. No pertinent surgical history.     Home Medications    Prior to Admission medications   Medication Sig Start Date End Date Taking? Authorizing Provider  cetirizine-pseudoephedrine (ZYRTEC-D) 5-120 MG tablet Take 1 tablet by mouth daily. 09/19/20   Belinda Fisher, PA-C    Family History Family History  Problem Relation Age of Onset  . Cancer Father     Social History Social History   Tobacco Use  . Smoking status: Never Smoker  . Smokeless tobacco: Never Used  Substance Use Topics  . Alcohol use: No    Alcohol/week: 0.0 standard drinks  . Drug use: No     Allergies   Penicillins and Tessalon [benzonatate]   Review of Systems Review of Systems  Reason unable to perform ROS: See HPI as above.     Physical Exam Triage Vital Signs ED Triage Vitals  Enc Vitals Group     BP 09/19/20 1124 114/69     Pulse Rate 09/19/20 1124 90     Resp 09/19/20 1124 18     Temp 09/19/20 1124 98.4 F (36.9 C)     Temp Source 09/19/20 1124 Oral     SpO2 09/19/20 1124 98 %     Weight --      Height --      Head Circumference --      Peak Flow --      Pain Score 09/19/20 1125 5     Pain Loc --      Pain Edu? --      Excl. in GC? --    No data found.  Updated Vital  Signs BP 114/69 (BP Location: Left Arm)   Pulse 90   Temp 98.4 F (36.9 C) (Oral)   Resp 18   SpO2 98%   Physical Exam Constitutional:      General: He is not in acute distress.    Appearance: He is well-developed. He is not ill-appearing, toxic-appearing or diaphoretic.  HENT:     Head: Normocephalic and atraumatic.     Right Ear: Tympanic membrane, ear canal and external ear normal. Tympanic membrane is not erythematous or bulging.     Left Ear: Tympanic membrane, ear canal and external ear normal. Tympanic membrane is not erythematous or bulging.     Nose:     Right Sinus: No maxillary sinus tenderness or frontal sinus tenderness.     Left Sinus: No maxillary sinus tenderness or frontal sinus tenderness.     Mouth/Throat:     Mouth: Mucous membranes are moist.     Pharynx: Oropharynx is clear. Uvula midline.  Eyes:     Conjunctiva/sclera: Conjunctivae normal.     Pupils: Pupils are  equal, round, and reactive to light.  Cardiovascular:     Rate and Rhythm: Normal rate and regular rhythm.  Pulmonary:     Effort: Pulmonary effort is normal. No accessory muscle usage, prolonged expiration, respiratory distress or retractions.     Breath sounds: No decreased air movement or transmitted upper airway sounds. No decreased breath sounds.     Comments: LCTAB Musculoskeletal:     Cervical back: Normal range of motion and neck supple.  Skin:    General: Skin is warm and dry.  Neurological:     Mental Status: He is alert and oriented to person, place, and time.      UC Treatments / Results  Labs (all labs ordered are listed, but only abnormal results are displayed) Labs Reviewed  NOVEL CORONAVIRUS, NAA    EKG   Radiology No results found.  Procedures Procedures (including critical care time)  Medications Ordered in UC Medications - No data to display  Initial Impression / Assessment and Plan / UC Course  I have reviewed the triage vital signs and the nursing  notes.  Pertinent labs & imaging results that were available during my care of the patient were reviewed by me and considered in my medical decision making (see chart for details).    COVID PCR test ordered. Patient to quarantine until testing results return. No alarming signs on exam. LCTAB. Symptomatic treatment discussed.  Push fluids.  Return precautions given.  Patient expresses understanding and agrees to plan.  Final Clinical Impressions(s) / UC Diagnoses   Final diagnoses:  Encounter for screening laboratory testing for COVID-19 virus  Cough  Throat irritation  Post-nasal drip  Body aches   ED Prescriptions    Medication Sig Dispense Auth. Provider   cetirizine-pseudoephedrine (ZYRTEC-D) 5-120 MG tablet Take 1 tablet by mouth daily. 10 tablet Belinda Fisher, PA-C     PDMP not reviewed this encounter.   Belinda Fisher, PA-C 09/19/20 1207

## 2020-09-19 NOTE — Discharge Instructions (Signed)
COVID PCR testing ordered. I would like you to quarantine until testing results. Start zyrtec-D as directed. You can also use over the counter flonase if needed for nasal congestion/drainage. Tylenol/motrin for pain and fever. Keep hydrated, urine should be clear to pale yellow in color. If experiencing shortness of breath, trouble breathing, go to the emergency department for further evaluation needed.   For sore throat/cough try using a honey-based tea. Use 3 teaspoons of honey with juice squeezed from half lemon. Place shaved pieces of ginger into 1/2-1 cup of water and warm over stove top. Then mix the ingredients and repeat every 4 hours as needed.

## 2020-09-20 LAB — SARS-COV-2, NAA 2 DAY TAT

## 2020-09-20 LAB — NOVEL CORONAVIRUS, NAA: SARS-CoV-2, NAA: NOT DETECTED

## 2022-11-18 ENCOUNTER — Ambulatory Visit (INDEPENDENT_AMBULATORY_CARE_PROVIDER_SITE_OTHER): Payer: Self-pay | Admitting: Emergency Medicine

## 2022-11-18 ENCOUNTER — Encounter: Payer: Self-pay | Admitting: Emergency Medicine

## 2022-11-18 VITALS — BP 120/70 | HR 63 | Temp 97.8°F | Ht 61.0 in | Wt 162.0 lb

## 2022-11-18 DIAGNOSIS — Z1211 Encounter for screening for malignant neoplasm of colon: Secondary | ICD-10-CM

## 2022-11-18 DIAGNOSIS — Z13228 Encounter for screening for other metabolic disorders: Secondary | ICD-10-CM

## 2022-11-18 DIAGNOSIS — Z1329 Encounter for screening for other suspected endocrine disorder: Secondary | ICD-10-CM

## 2022-11-18 DIAGNOSIS — Z13220 Encounter for screening for lipoid disorders: Secondary | ICD-10-CM

## 2022-11-18 DIAGNOSIS — Z130 Encounter for screening for diseases of the blood and blood-forming organs and certain disorders involving the immune mechanism: Secondary | ICD-10-CM

## 2022-11-18 DIAGNOSIS — Z1159 Encounter for screening for other viral diseases: Secondary | ICD-10-CM

## 2022-11-18 DIAGNOSIS — Z125 Encounter for screening for malignant neoplasm of prostate: Secondary | ICD-10-CM

## 2022-11-18 DIAGNOSIS — Z0000 Encounter for general adult medical examination without abnormal findings: Secondary | ICD-10-CM

## 2022-11-18 DIAGNOSIS — B351 Tinea unguium: Secondary | ICD-10-CM

## 2022-11-18 LAB — CBC WITH DIFFERENTIAL/PLATELET
Basophils Absolute: 0.1 10*3/uL (ref 0.0–0.1)
Basophils Relative: 0.8 % (ref 0.0–3.0)
Eosinophils Absolute: 0.1 10*3/uL (ref 0.0–0.7)
Eosinophils Relative: 1.7 % (ref 0.0–5.0)
HCT: 46 % (ref 39.0–52.0)
Hemoglobin: 15.8 g/dL (ref 13.0–17.0)
Lymphocytes Relative: 21.1 % (ref 12.0–46.0)
Lymphs Abs: 1.4 10*3/uL (ref 0.7–4.0)
MCHC: 34.4 g/dL (ref 30.0–36.0)
MCV: 94.1 fl (ref 78.0–100.0)
Monocytes Absolute: 0.5 10*3/uL (ref 0.1–1.0)
Monocytes Relative: 8 % (ref 3.0–12.0)
Neutro Abs: 4.4 10*3/uL (ref 1.4–7.7)
Neutrophils Relative %: 68.4 % (ref 43.0–77.0)
Platelets: 368 10*3/uL (ref 150.0–400.0)
RBC: 4.88 Mil/uL (ref 4.22–5.81)
RDW: 13.6 % (ref 11.5–15.5)
WBC: 6.5 10*3/uL (ref 4.0–10.5)

## 2022-11-18 LAB — COMPREHENSIVE METABOLIC PANEL
ALT: 36 U/L (ref 0–53)
AST: 23 U/L (ref 0–37)
Albumin: 4.3 g/dL (ref 3.5–5.2)
Alkaline Phosphatase: 62 U/L (ref 39–117)
BUN: 16 mg/dL (ref 6–23)
CO2: 30 mEq/L (ref 19–32)
Calcium: 9.4 mg/dL (ref 8.4–10.5)
Chloride: 102 mEq/L (ref 96–112)
Creatinine, Ser: 0.96 mg/dL (ref 0.40–1.50)
GFR: 93.95 mL/min (ref >60.00)
Glucose, Bld: 99 mg/dL (ref 70–99)
Potassium: 4.3 mEq/L (ref 3.5–5.1)
Sodium: 139 mEq/L (ref 135–145)
Total Bilirubin: 0.7 mg/dL (ref 0.2–1.2)
Total Protein: 7.1 g/dL (ref 6.0–8.3)

## 2022-11-18 LAB — LIPID PANEL
Cholesterol: 212 mg/dL — ABNORMAL HIGH (ref 0–200)
HDL: 56.3 mg/dL (ref >39.00)
LDL Cholesterol: 132 mg/dL — ABNORMAL HIGH (ref 0–99)
NonHDL: 155.34
Total CHOL/HDL Ratio: 4
Triglycerides: 118 mg/dL (ref 0.0–149.0)
VLDL: 23.6 mg/dL (ref 0.0–40.0)

## 2022-11-18 LAB — PSA: PSA: 0.8 ng/mL (ref 0.10–4.00)

## 2022-11-18 LAB — HEMOGLOBIN A1C: Hgb A1c MFr Bld: 5.9 % (ref 4.6–6.5)

## 2022-11-18 NOTE — Patient Instructions (Signed)
Mantenimiento de la salud en los hombres Health Maintenance, Male Adoptar un estilo de vida saludable y recibir atencin preventiva son importantes para promover la salud y el bienestar. Consulte al mdico sobre: El esquema adecuado para hacerse pruebas y exmenes peridicos. Cosas que puede hacer por su cuenta para prevenir enfermedades y mantenerse sano. Qu debo saber sobre la dieta, el peso y el ejercicio? Consuma una dieta saludable  Consuma una dieta que incluya muchas verduras, frutas, productos lcteos con bajo contenido de grasa y protenas magras. No consuma muchos alimentos ricos en grasas slidas, azcares agregados o sodio. Mantenga un peso saludable El ndice de masa muscular (IMC) es una medida que puede utilizarse para identificar posibles problemas de peso. Proporciona una estimacin de la grasa corporal basndose en el peso y la altura. Su mdico puede ayudarle a determinar su IMC y a lograr o mantener un peso saludable. Haga ejercicio con regularidad Haga ejercicio con regularidad. Esta es una de las prcticas ms importantes que puede hacer por su salud. La mayora de los adultos deben seguir estas pautas: Realizar, al menos, 150 minutos de actividad fsica por semana. El ejercicio debe aumentar la frecuencia cardaca y hacerlo transpirar (ejercicio de intensidad moderada). Hacer ejercicios de fortalecimiento por lo menos dos veces por semana. Agregue esto a su plan de ejercicio de intensidad moderada. Pase menos tiempo sentado. Incluso la actividad fsica ligera puede ser beneficiosa. Controle sus niveles de colesterol y lpidos en la sangre Comience a realizarse anlisis de lpidos y colesterol en la sangre a los 20 aos y luego reptalos cada 5 aos. Es posible que necesite controlar los niveles de colesterol con mayor frecuencia si: Sus niveles de lpidos y colesterol son altos. Es mayor de 40 aos. Presenta un alto riesgo de padecer enfermedades cardacas. Qu debo  saber sobre las pruebas de deteccin del cncer? Muchos tipos de cncer pueden detectarse de manera temprana y, a menudo, pueden prevenirse. Segn su historia clnica y sus antecedentes familiares, es posible que deba realizarse pruebas de deteccin del cncer en diferentes edades. Esto puede incluir pruebas de deteccin de lo siguiente: Cncer colorrectal. Cncer de prstata. Cncer de piel. Cncer de pulmn. Qu debo saber sobre la enfermedad cardaca, la diabetes y la hipertensin arterial? Presin arterial y enfermedad cardaca La hipertensin arterial causa enfermedades cardacas y aumenta el riesgo de accidente cerebrovascular. Es ms probable que esto se manifieste en las personas que tienen lecturas de presin arterial alta o tienen sobrepeso. Hable con el mdico sobre sus valores de presin arterial deseados. Hgase controlar la presin arterial: Cada 3 a 5 aos si tiene entre 18 y 39 aos. Todos los aos si es mayor de 40 aos. Si tiene entre 65 y 75 aos y es fumador o sola fumar, pregntele al mdico si debe realizarse una prueba de deteccin de aneurisma artico abdominal (AAA) por nica vez. Diabetes Realcese exmenes de deteccin de la diabetes con regularidad. Este anlisis revisa el nivel de azcar en la sangre en ayunas. Hgase las pruebas de deteccin: Cada tres aos despus de los 45 aos de edad si tiene un peso normal y un bajo riesgo de padecer diabetes. Con ms frecuencia y a partir de una edad inferior si tiene sobrepeso o un alto riesgo de padecer diabetes. Qu debo saber sobre la prevencin de infecciones? Hepatitis B Si tiene un riesgo ms alto de contraer hepatitis B, debe someterse a un examen de deteccin de este virus. Hable con el mdico para averiguar si tiene riesgo de   contraer la infeccin por hepatitis B. Hepatitis C Se recomienda un anlisis de sangre para: Todos los que nacieron entre 1945 y 1965. Todas las personas que tengan un riesgo de haber  contrado hepatitis C. Enfermedades de transmisin sexual (ETS) Debe realizarse pruebas de deteccin de ITS todos los aos, incluidas la gonorrea y la clamidia, si: Es sexualmente activo y es menor de 24 aos. Es mayor de 24 aos, y el mdico le informa que corre riesgo de tener este tipo de infecciones. La actividad sexual ha cambiado desde que le hicieron la ltima prueba de deteccin y tiene un riesgo mayor de tener clamidia o gonorrea. Pregntele al mdico si usted tiene riesgo. Pregntele al mdico si usted tiene un alto riesgo de contraer VIH. El mdico tambin puede recomendarle un medicamento recetado para ayudar a evitar la infeccin por el VIH. Si elige tomar medicamentos para prevenir el VIH, primero debe hacerse los anlisis de deteccin del VIH. Luego debe hacerse anlisis cada 3 meses mientras est tomando los medicamentos. Siga estas indicaciones en su casa: Consumo de alcohol No beba alcohol si el mdico se lo prohbe. Si bebe alcohol: Limite la cantidad que consume de 0 a 2 bebidas por da. Sepa cunta cantidad de alcohol hay en las bebidas que toma. En los [place], una medida equivale a una botella de cerveza de 12 oz (355 ml), un vaso de vino de 5 oz (148 ml) o un vaso de una bebida alcohlica de alta graduacin de 1 oz (44 ml). Estilo de vida No consuma ningn producto que contenga nicotina o tabaco. Estos productos incluyen cigarrillos, tabaco para mascar y aparatos de vapeo, como los cigarrillos electrnicos. Si necesita ayuda para dejar de consumir estos productos, consulte al mdico. No consuma drogas. No comparta agujas. Solicite ayuda a su mdico si necesita apoyo o informacin para abandonar las drogas. Indicaciones generales Realcese los estudios de rutina de la salud, dentales y de la vista. Mantngase al da con las vacunas. Infrmele a su mdico si: Se siente deprimido con frecuencia. Alguna vez ha sido vctima de maltrato o no se siente seguro en su  casa. Resumen Adoptar un estilo de vida saludable y recibir atencin preventiva son importantes para promover la salud y el bienestar. Siga las instrucciones del mdico acerca de una dieta saludable, el ejercicio y la realizacin de pruebas o exmenes para detectar enfermedades. Siga las instrucciones del mdico con respecto al control del colesterol y la presin arterial. Esta informacin no tiene como fin reemplazar el consejo del mdico. Asegrese de hacerle al mdico cualquier pregunta que tenga. Document Revised: 04/29/2021 Document Reviewed: 04/29/2021 Elsevier Patient Education  2023 Elsevier Inc.  

## 2022-11-18 NOTE — Progress Notes (Signed)
Hays Arredondo-Hernandez 46 y.o.   Chief Complaint  Patient presents with   Annual Exam    HISTORY OF PRESENT ILLNESS: This is a 46 y.o. male first visit to this office, here to establish care with me. Requesting annual physical. Originally from [place], [place] Works as a Education administrator No chronic medical problems.  No chronic medications Non-smoker.  No EtOH abuse. Complaining of fungal infection to both big toenails.  Has tried several over-the-counter medications without success.  HPI   Prior to Admission medications   Medication Sig Start Date End Date Taking? Authorizing Provider  cetirizine-pseudoephedrine (ZYRTEC-D) 5-120 MG tablet Take 1 tablet by mouth daily. Patient not taking: Reported on 11/18/2022 09/19/20   Belinda Fisher, PA-C    Allergies  Allergen Reactions   Penicillins     childhood   Tessalon [Benzonatate] Dermatitis    Abdomen numb and severe dry mouth    Patient Active Problem List   Diagnosis Date Noted   Seasonal allergic rhinitis 02/27/2018    Past Medical History:  Diagnosis Date   Allergy     History reviewed. No pertinent surgical history.  Social History   Socioeconomic History   Marital status: Married    Spouse name: Not on file   Number of children: Not on file   Years of education: Not on file   Highest education level: Not on file  Occupational History   Not on file  Tobacco Use   Smoking status: Never   Smokeless tobacco: Never  Substance and Sexual Activity   Alcohol use: No    Alcohol/week: 0.0 standard drinks of alcohol   Drug use: No   Sexual activity: Yes    Birth control/protection: None  Other Topics Concern   Not on file  Social History Narrative   Not on file   Social Determinants of Health   Financial Resource Strain: Not on file  Food Insecurity: Not on file  Transportation Needs: Not on file  Physical Activity: Not on file  Stress: Not on file  Social Connections: Not on file  Intimate Partner  Violence: Not on file    Family History  Problem Relation Age of Onset   Cancer Father      Review of Systems  Constitutional: Negative.  Negative for chills and fever.  HENT:  Negative for congestion and sore throat.   Respiratory: Negative.  Negative for cough and shortness of breath.   Cardiovascular: Negative.  Negative for chest pain and palpitations.  Gastrointestinal:  Negative for abdominal pain, diarrhea, nausea and vomiting.  Genitourinary: Negative.  Negative for dysuria and hematuria.  Musculoskeletal:  Positive for joint pain (Bilateral shoulder pains mostly after work).  Skin: Negative.   Neurological: Negative.  Negative for dizziness and headaches.   Today's Vitals   11/18/22 0842  BP: 120/70  Pulse: 63  Temp: 97.8 F (36.6 C)  TempSrc: Oral  SpO2: 99%  Weight: 162 lb (73.5 kg)  Height: 5\' 1"  (1.549 m)   Body mass index is 30.61 kg/m.   Physical Exam Vitals reviewed.  Constitutional:      Appearance: Normal appearance.  HENT:     Head: Normocephalic.     Right Ear: Tympanic membrane, ear canal and external ear normal.     Left Ear: Tympanic membrane, ear canal and external ear normal.     Mouth/Throat:     Mouth: Mucous membranes are moist.     Pharynx: Oropharynx is clear.  Eyes:     Extraocular Movements:  Extraocular movements intact.     Conjunctiva/sclera: Conjunctivae normal.     Pupils: Pupils are equal, round, and reactive to light.  Cardiovascular:     Rate and Rhythm: Normal rate and regular rhythm.     Pulses: Normal pulses.     Heart sounds: Normal heart sounds.  Pulmonary:     Effort: Pulmonary effort is normal.     Breath sounds: Normal breath sounds.  Abdominal:     Palpations: Abdomen is soft.     Tenderness: There is no abdominal tenderness.  Musculoskeletal:        General: Normal range of motion.     Cervical back: No tenderness.  Lymphadenopathy:     Cervical: No cervical adenopathy.  Skin:    General: Skin is warm  and dry.     Comments: Onychomycosis both big toes  Neurological:     General: No focal deficit present.     Mental Status: He is alert and oriented to person, place, and time.  Psychiatric:        Mood and Affect: Mood normal.        Behavior: Behavior normal.      ASSESSMENT & PLAN: Problem List Items Addressed This Visit   None Visit Diagnoses     Routine general medical examination at a health care facility    -  Primary   Prostate cancer screening       Relevant Orders   PSA(Must document that pt has been informed of limitations of PSA testing.)   Need for hepatitis C screening test       Relevant Orders   Hepatitis C antibody screen   Colon cancer screening       Relevant Orders   Cologuard   Screening for deficiency anemia       Relevant Orders   CBC with Differential   Screening for lipoid disorders       Relevant Orders   Lipid panel   Screening for endocrine, metabolic and immunity disorder       Relevant Orders   Comprehensive metabolic panel   Hemoglobin A1c   Onychomycosis of great toe            Modifiable risk factors discussed with patient. Anticipatory guidance according to age provided. The following topics were also discussed: Social Determinants of Health Smoking.  Non-smoker Diet and nutrition Benefits of exercise Cancer family history review Vaccinations reviewed and recommendations Cardiovascular risk assessment and need for blood work Mental health including depression and anxiety Fall and accident prevention  Patient Instructions  Mantenimiento de Radiographer, therapeuticla salud en los hombres Health Maintenance, Male Adoptar un estilo de vida saludable y recibir atencin preventiva son importantes para promover la salud y Counsellorel bienestar. Consulte al mdico sobre: El esquema adecuado para hacerse pruebas y exmenes peridicos. Cosas que puede hacer por su cuenta para prevenir enfermedades y [place]enerse sano. Qu debo saber sobre la dieta, el peso y el  ejercicio? Consuma una dieta saludable  Consuma una dieta que incluya muchas verduras, frutas, productos lcteos con bajo contenido de [place]grasa y Associate Professorprotenas magras. No consuma muchos alimentos ricos en grasas slidas, azcares agregados o sodio. Mantenga un peso saludable El ndice de masa muscular [facility](IMC) es una medida que puede utilizarse para identificar posibles problemas de [place]. Proporciona una estimacin de la grasa corporal basndose en el peso y la altura. Su mdico puede ayudarle a Engineer, sitedeterminar su IMC y a Personnel officerlograr o Pharmacologistmantener un peso saludable. Haga ejercicio con regularidad [employer]ero EnergyHaga  ejercicio con regularidad. Esta es una de las prcticas ms importantes que puede hacer por su salud. La [employer] de los adultos deben seguir estas pautas: Education officer, environmental, al menos, 150 minutos de actividad fsica por semana. El ejercicio debe aumentar la frecuencia cardaca y Media planner transpirar (ejercicio de intensidad moderada). Hacer ejercicios de fortalecimiento por lo [employer] por semana. Agregue esto a su plan de ejercicio de intensidad moderada. Pase menos tiempo sentado. Incluso la actividad fsica ligera puede ser beneficiosa. Controle sus niveles de colesterol y lpidos en la sangre Comience a realizarse anlisis de lpidos y Oncologist en la sangre a los 20 aos y luego reptalos cada 5 aos. Es posible que Insurance underwriter los niveles de colesterol con mayor frecuencia si: Sus niveles de lpidos y colesterol son altos. Es mayor de 40 aos. Presenta un alto riesgo de padecer enfermedades cardacas. Qu debo saber sobre las pruebas de deteccin del cncer? Muchos tipos de cncer pueden detectarse de manera temprana y, a menudo, pueden prevenirse. Segn su historia clnica y sus antecedentes familiares, es posible que deba realizarse pruebas de deteccin del cncer en diferentes edades. Esto puede incluir pruebas de deteccin de lo siguiente: Building services engineer. Cncer de prstata. Cncer de piel. Cncer de  pulmn. Qu debo saber sobre la enfermedad cardaca, la diabetes y la hipertensin arterial? Presin arterial y enfermedad cardaca La hipertensin arterial causa enfermedades cardacas y [place] el riesgo de accidente cerebrovascular. Es ms probable que esto se manifieste en las personas que tienen lecturas de presin arterial alta o tienen sobrepeso. Hable con el mdico sobre sus valores de presin arterial deseados. Hgase controlar la presin arterial: Cada 3 a 5 aos si tiene entre 18 y 75 aos. Todos los aos si es mayor de 40 aos. Si tiene entre 65 y 5 aos y es fumador o Insurance underwriter, pregntele al mdico si debe realizarse una prueba de deteccin de aneurisma artico abdominal (AAA) por nica vez. Diabetes Realcese exmenes de deteccin de la diabetes con regularidad. Este anlisis revisa el nivel de azcar en la sangre en [place]. Hgase las pruebas de deteccin: Cada tres aos despus de los 45 aos de edad si tiene un peso normal y un bajo riesgo de padecer diabetes. Con ms frecuencia y a partir de [place] edad inferior si tiene sobrepeso o un alto riesgo de padecer diabetes. Qu debo saber sobre la prevencin de infecciones? Hepatitis B Si tiene un riesgo ms alto de contraer hepatitis B, debe someterse a un examen de deteccin de este virus. Hable con el mdico para averiguar si tiene riesgo de contraer la infeccin por hepatitis B. Hepatitis C Se recomienda un anlisis de [place] para: Todos los que nacieron entre 1945 y [phone]. Todas las personas que tengan un riesgo de haber contrado hepatitis C. Enfermedades de transmisin sexual (ETS) Debe realizarse pruebas de deteccin de ITS todos los aos, incluidas la gonorrea y la clamidia, si: Es sexualmente activo y es menor de [address]. Es mayor de [address], y Public affairs consultant informa que corre riesgo de tener este tipo de infecciones. La actividad sexual ha cambiado desde que le hicieron la ltima prueba de deteccin y tiene un riesgo  mayor de Warehouse manager clamidia o Copy. Pregntele al mdico si usted tiene riesgo. Pregntele al mdico si usted tiene un alto riesgo de Primary school teacher VIH. El mdico tambin puede recomendarle un medicamento recetado para ayudar a evitar la infeccin por el VIH. Si elige tomar medicamentos para prevenir el VIH, primero debe [employer]  anlisis de deteccin del VIH. Luego debe hacerse anlisis cada 3 meses mientras est tomando los medicamentos. Siga estas indicaciones en su casa: Consumo de alcohol No beba alcohol si el mdico se lo prohbe. Si bebe alcohol: Limite la cantidad que consume de 0 a 2 bebidas por da. Sepa cunta cantidad de alcohol hay en las bebidas que toma. En los [address], una medida equivale a una botella de cerveza de 12 oz (355 ml), un vaso de vino de 5 oz (148 ml) o un vaso de una bebida alcohlica de alta graduacin de 1 oz (44 ml). Estilo de vida No consuma ningn producto que contenga nicotina o tabaco. Estos productos incluyen cigarrillos, tabaco para Theatre manager y aparatos de vapeo, como los Administrator, Civil Service. Si necesita ayuda para dejar de consumir estos productos, consulte al mdico. No consuma drogas. No comparta agujas. Solicite ayuda a su mdico si necesita apoyo o informacin para abandonar las drogas. Indicaciones generales Realcese los estudios de rutina de [address], dentales y de Wellsite geologist. Mantngase al da con las vacunas. Infrmele a su mdico si: Se siente deprimido con frecuencia. Alguna vez ha sido vctima de [place] o no se siente seguro en su casa. Resumen Adoptar un estilo de vida saludable y recibir atencin preventiva son importantes para promover la salud y Counsellor. Siga las instrucciones del mdico acerca de una dieta saludable, el ejercicio y la realizacin de pruebas o exmenes para Hotel manager. Siga las instrucciones del mdico con respecto al control del colesterol y la presin arterial. Esta informacin no tiene Microbiologist el consejo del mdico. Asegrese de hacerle al mdico cualquier pregunta que tenga. Document Revised: 04/29/2021 Document Reviewed: 04/29/2021 Elsevier Patient Education  2023 Elsevier Inc.     Edwina Barth, MD [facility]

## 2022-11-19 LAB — HEPATITIS C ANTIBODY: Hepatitis C Ab: NONREACTIVE

## 2022-12-29 LAB — COLOGUARD: COLOGUARD: NEGATIVE
